# Patient Record
Sex: Male | Born: 2000 | Race: Black or African American | Hispanic: No | Marital: Single | Smoking: Never smoker
Health system: Southern US, Community
[De-identification: ages and names within clinical notes are randomized; demographics above are authoritative.]

## PROBLEM LIST (undated history)

## (undated) DIAGNOSIS — Z9229 Personal history of other drug therapy: Secondary | ICD-10-CM

## (undated) DIAGNOSIS — K2970 Gastritis, unspecified, without bleeding: Secondary | ICD-10-CM

## (undated) DIAGNOSIS — F40240 Claustrophobia: Secondary | ICD-10-CM

## (undated) DIAGNOSIS — F32A Depression, unspecified: Secondary | ICD-10-CM

## (undated) DIAGNOSIS — F411 Generalized anxiety disorder: Secondary | ICD-10-CM

## (undated) DIAGNOSIS — I10 Essential (primary) hypertension: Secondary | ICD-10-CM

## (undated) DIAGNOSIS — F909 Attention-deficit hyperactivity disorder, unspecified type: Secondary | ICD-10-CM

## (undated) DIAGNOSIS — K219 Gastro-esophageal reflux disease without esophagitis: Secondary | ICD-10-CM

## (undated) DIAGNOSIS — F419 Anxiety disorder, unspecified: Secondary | ICD-10-CM

## (undated) HISTORY — DX: Gastritis, unspecified, without bleeding: K29.70

## (undated) HISTORY — DX: Claustrophobia: F40.240

## (undated) HISTORY — PX: ABDOMINAL SURGERY: SHX537

---
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
## 2021-01-01 ENCOUNTER — Encounter (INDEPENDENT_AMBULATORY_CARE_PROVIDER_SITE_OTHER): Payer: Self-pay

## 2021-01-01 NOTE — Nursing Progress Note (Signed)
Day Before Surgery Confirmation Call    Spoke to:  Left Message: for patient    Confirmed surgery date, arrival time, and location.   Arrival: 0530  Surgery: 0730    NPO instructions reviewed: Clear liquids up to 2 hours prior to arrival time, then NPO. No solid food 8 hours prior to scheduled procedure time. Examples of clear liquids include water, apple juice, sports drinks such as Gatorade, coffee or tea without milk or cream. Sugar or sweetener may be added    Fasting Requirements per Preoperative Fasting Guidelines for Elective Surgeries and Procedures Requiring Anesthesia Policy Revised 01/2020  Ingested material Fasting requirement   Clear liquids/Ice Chips 2 hours prior to arrival time   Breast milk 4 hours prior to scheduled procedure time   Infant formula 6 hours prior to scheduled procedure time   Non-human milk 8 hours prior to scheduled procedure time   Solid food 8 hours prior to scheduled procedure time       Ambulatory Screening Tool:   Patient instructed that if you or your family does develop new symptoms of acute respiratory illness, to contact surgeons office immediately, prior to arrival at the hospital.     Patient denies recent visit to ED, hospitalization or PCP visit for acute illness since PSS RN interview.     Visitor Restriction Guidelines per [facility] Policy as of 12/04/20:  All visitors must adhere to the following:   . Exhibit no COVID-19 symptoms   . Age 16+   . In keeping with the CDC's current guidance, regardless of vaccination status, everyone in a healthcare facility must wear a mask covering their mouth and nose the entire time they are in the facility. Visitors who fail to wear a mask properly will be asked to leave. The following face coverings cannot be worn at any [place] location: gaiter style masks, bandanas or vented masks.   . No visitors are allowed for patients with suspected or confirmed COVID-19, except in end-of-life situations.  Hospital Inpatient:   Visitation  hours: 9:00 a.m. - 6:30 p.m. daily   . Adult patients may have two (age 16+) per day.  . Pediatric patients may have two parents/guardians at bedside 24/7.   Outpatient/Ambulatory Surgery:   . Adult patients may have only one designated visitor (age 16+) to accompany the patient  . For pediatric outpatients, only parents/guardians at bedside.   . No family members/visitors will be allowed into Phase 1 recovery areas. Physicians will call contact person to give report of procedure.   . Family / visitor will be called by PACU staff  to review discharge instructions via phone and answer any questions.     Advise to call surgeon if need to cancel surgery arises.     Patient verbalized understanding and acceptance of above information.  If a voicemail is left for the patient and any of the COVID19 ambulatory screening questions are positive, patient is asked to call PSS ASAP.

## 2021-01-03 NOTE — Anesthesia Preprocedure Evaluation (Addendum)
Anesthesia Evaluation    AIRWAY    Mallampati: II    TM distance: >3 FB  Neck ROM: full  Mouth Opening:full  Planned to use difficult airway equipment: Yes CARDIOVASCULAR    cardiovascular exam normal       DENTAL    no notable dental hx     PULMONARY    pulmonary exam normal     OTHER FINDINGS    BMI 52  GERD  HTN  Anxiety/depression  ADHD            Relevant Problems   NEURO/PSYCH   (+) Migraine      CARDIO   (+) Benign essential hypertension   (+) Migraine      GI   (+) Gastroesophageal reflux disease without esophagitis               Anesthesia Plan    ASA 3     general                     intravenous induction   Detailed anesthesia plan: general endotracheal        Post op pain management: per surgeon    informed consent obtained    Plan discussed with CRNA.    ECG reviewed  pertinent labs reviewed             Signed by: Lucile Shutters, MD 01/03/21 11:45 AM

## 2021-01-04 ENCOUNTER — Inpatient Hospital Stay: Payer: Commercial Managed Care - POS | Admitting: Anesthesiology

## 2021-01-04 ENCOUNTER — Encounter: Payer: Self-pay | Admitting: Surgery

## 2021-01-04 ENCOUNTER — Inpatient Hospital Stay
Admission: RE | Admit: 2021-01-04 | Discharge: 2021-01-05 | DRG: 621 | Disposition: A | Discharge status: Home / Self Care | Payer: Commercial Managed Care - POS | Source: Ambulatory Visit | Attending: Surgery | Admitting: Surgery

## 2021-01-04 ENCOUNTER — Encounter
Admission: RE | Disposition: A | Discharge status: Home / Self Care | Payer: Self-pay | Source: Ambulatory Visit | Attending: Surgery

## 2021-01-04 DIAGNOSIS — F411 Generalized anxiety disorder: Secondary | ICD-10-CM | POA: Diagnosis present

## 2021-01-04 DIAGNOSIS — K219 Gastro-esophageal reflux disease without esophagitis: Secondary | ICD-10-CM | POA: Diagnosis present

## 2021-01-04 DIAGNOSIS — I10 Essential (primary) hypertension: Secondary | ICD-10-CM | POA: Diagnosis present

## 2021-01-04 DIAGNOSIS — J029 Acute pharyngitis, unspecified: Secondary | ICD-10-CM | POA: Diagnosis not present

## 2021-01-04 DIAGNOSIS — Z79899 Other long term (current) drug therapy: Secondary | ICD-10-CM

## 2021-01-04 DIAGNOSIS — F902 Attention-deficit hyperactivity disorder, combined type: Secondary | ICD-10-CM | POA: Diagnosis present

## 2021-01-04 DIAGNOSIS — R141 Gas pain: Secondary | ICD-10-CM | POA: Diagnosis not present

## 2021-01-04 HISTORY — PX: LAPAROSCOPIC, GASTRECTOMY SLEEVE: SHX4495

## 2021-01-04 LAB — GLUCOSE WHOLE BLOOD - POCT: Whole Blood Glucose POCT: 91 mg/dL (ref 70–100)

## 2021-01-04 SURGERY — LAPAROSCOPIC, GASTRECTOMY SLEEVE
Anesthesia: Anesthesia General | Site: Abdomen | Wound class: Clean Contaminated

## 2021-01-04 MED ORDER — NEOSTIGMINE METHYLSULFATE 1 MG/ML IJ/IV SOLN (WRAP)
Status: AC
Start: 2021-01-04 — End: ?
  Filled 2021-01-04: qty 10

## 2021-01-04 MED ORDER — ONDANSETRON HCL 4 MG/2ML IJ SOLN
4.0000 mg | Freq: Once | INTRAMUSCULAR | Status: DC | PRN
Start: 2021-01-04 — End: 2021-01-04

## 2021-01-04 MED ORDER — DEXTROSE 50 % IV SOLN
25.0000 g | INTRAVENOUS | Status: DC | PRN
Start: 2021-01-04 — End: 2021-01-05

## 2021-01-04 MED ORDER — GABAPENTIN 300 MG PO CAPS
300.0000 mg | ORAL_CAPSULE | Freq: Three times a day (TID) | ORAL | Status: DC
Start: 2021-01-05 — End: 2021-01-05
  Administered 2021-01-05 (×2): 300 mg via ORAL
  Filled 2021-01-04 (×2): qty 1

## 2021-01-04 MED ORDER — PROPOFOL 10 MG/ML IV EMUL (WRAP)
INTRAVENOUS | Status: DC | PRN
Start: 2021-01-04 — End: 2021-01-04
  Administered 2021-01-04: 370 mg via INTRAVENOUS

## 2021-01-04 MED ORDER — ACETAMINOPHEN 10 MG/ML IV SOLN
1000.0000 mg | Freq: Once | INTRAVENOUS | Status: DC
Start: 2021-01-04 — End: 2021-01-04

## 2021-01-04 MED ORDER — ACETAMINOPHEN 10 MG/ML IV SOLN
INTRAVENOUS | Status: AC
Start: 2021-01-04 — End: ?
  Filled 2021-01-04: qty 100

## 2021-01-04 MED ORDER — MAGNESIUM SULFATE IN D5W 1-5 GM/100ML-% IV SOLN
INTRAVENOUS | Status: AC
Start: 2021-01-04 — End: ?
  Filled 2021-01-04: qty 200

## 2021-01-04 MED ORDER — FAMOTIDINE 10 MG/ML IV SOLN (WRAP)
20.0000 mg | INTRAVENOUS | Status: AC
Start: 2021-01-04 — End: 2021-01-04
  Administered 2021-01-04: 07:00:00 20 mg via INTRAVENOUS

## 2021-01-04 MED ORDER — ROCURONIUM BROMIDE 50 MG/5ML IV SOLN
INTRAVENOUS | Status: AC
Start: 2021-01-04 — End: ?
  Filled 2021-01-04: qty 5

## 2021-01-04 MED ORDER — GABAPENTIN 300 MG PO CAPS
ORAL_CAPSULE | ORAL | Status: AC
Start: 2021-01-04 — End: ?
  Filled 2021-01-04: qty 1

## 2021-01-04 MED ORDER — BUPIVACAINE LIPOSOME 1.3 % IJ SUSP
INTRAMUSCULAR | Status: DC | PRN
Start: 2021-01-04 — End: 2021-01-04
  Administered 2021-01-04: 20 mL

## 2021-01-04 MED ORDER — HYDROMORPHONE HCL 0.5 MG/0.5 ML IJ SOLN
0.5000 mg | INTRAMUSCULAR | Status: DC | PRN
Start: 2021-01-04 — End: 2021-01-04

## 2021-01-04 MED ORDER — SODIUM CHLORIDE 0.9 % IV BOLUS
500.0000 mL | INTRAVENOUS | Status: DC
Start: 2021-01-04 — End: 2021-01-04
  Administered 2021-01-04: 07:00:00 500 mL via INTRAVENOUS

## 2021-01-04 MED ORDER — GABAPENTIN 300 MG PO CAPS
300.0000 mg | ORAL_CAPSULE | Freq: Once | ORAL | Status: AC
Start: 2021-01-04 — End: 2021-01-04
  Administered 2021-01-04: 07:00:00 300 mg via ORAL

## 2021-01-04 MED ORDER — OXYCODONE HCL 5 MG/5ML PO SOLN
5.0000 mg | ORAL | Status: DC | PRN
Start: 2021-01-05 — End: 2021-01-05
  Administered 2021-01-05: 5 mg via ORAL
  Filled 2021-01-04: qty 5

## 2021-01-04 MED ORDER — SUCCINYLCHOLINE CHLORIDE 20 MG/ML IJ SOLN
INTRAMUSCULAR | Status: AC
Start: 2021-01-04 — End: ?
  Filled 2021-01-04: qty 10

## 2021-01-04 MED ORDER — DEXTROSE 5% IV BOLUS
250.0000 mL | INTRAVENOUS | Status: DC | PRN
Start: 2021-01-04 — End: 2021-01-05

## 2021-01-04 MED ORDER — LACTATED RINGERS IR SOLN
Status: AC | PRN
Start: 2021-01-04 — End: 2021-01-04
  Administered 2021-01-04: 1000 mL

## 2021-01-04 MED ORDER — CEFTRIAXONE SODIUM 2 G IJ SOLR
INTRAMUSCULAR | Status: AC
Start: 2021-01-04 — End: ?
  Filled 2021-01-04: qty 2000

## 2021-01-04 MED ORDER — DIPHENHYDRAMINE HCL 50 MG/ML IJ SOLN
12.5000 mg | INTRAMUSCULAR | Status: DC | PRN
Start: 2021-01-04 — End: 2021-01-04

## 2021-01-04 MED ORDER — EPHEDRINE SULFATE 50 MG/ML IJ/IV SOLN (WRAP)
Status: DC | PRN
Start: 2021-01-04 — End: 2021-01-04
  Administered 2021-01-04: 5 mg via INTRAVENOUS
  Administered 2021-01-04 (×2): 10 mg via INTRAVENOUS

## 2021-01-04 MED ORDER — PROPOFOL 10 MG/ML IV EMUL (WRAP)
INTRAVENOUS | Status: AC
Start: 2021-01-04 — End: ?
  Filled 2021-01-04: qty 20

## 2021-01-04 MED ORDER — LIDOCAINE HCL 2 % IJ SOLN
INTRAMUSCULAR | Status: DC | PRN
Start: 2021-01-04 — End: 2021-01-04
  Administered 2021-01-04: 40 mg
  Administered 2021-01-04: 60 mg

## 2021-01-04 MED ORDER — GLYCOPYRROLATE 0.2 MG/ML IJ SOLN (WRAP)
INTRAMUSCULAR | Status: DC | PRN
Start: 2021-01-04 — End: 2021-01-04
  Administered 2021-01-04: .8 mg via INTRAVENOUS

## 2021-01-04 MED ORDER — ENOXAPARIN SODIUM 60 MG/0.6ML SC SOLN
60.0000 mg | Freq: Two times a day (BID) | SUBCUTANEOUS | Status: DC
Start: 2021-01-05 — End: 2021-01-05
  Administered 2021-01-05: 10:00:00 60 mg via SUBCUTANEOUS
  Filled 2021-01-04: qty 0.6

## 2021-01-04 MED ORDER — BUPIVACAINE LIPOSOME 1.3 % IJ SUSP
20.0000 mL | Freq: Once | INTRAMUSCULAR | Status: AC
Start: 2021-01-04 — End: 2021-01-04
  Administered 2021-01-04: 10:00:00 20 mL

## 2021-01-04 MED ORDER — HYOSCYAMINE SULFATE 0.125 MG SL SUBL
0.1250 mg | SUBLINGUAL_TABLET | Freq: Four times a day (QID) | SUBLINGUAL | Status: DC | PRN
Start: 2021-01-04 — End: 2021-01-05
  Administered 2021-01-04 – 2021-01-05 (×2): 0.125 mg via SUBLINGUAL
  Filled 2021-01-04 (×2): qty 1

## 2021-01-04 MED ORDER — ACETAMINOPHEN 160 MG/5ML PO SOLN
650.0000 mg | ORAL | Status: DC
Start: 2021-01-05 — End: 2021-01-05
  Administered 2021-01-05: 08:00:00 650 mg via ORAL
  Filled 2021-01-04 (×2): qty 20.3

## 2021-01-04 MED ORDER — ENOXAPARIN SODIUM 30 MG/0.3ML SC SOLN
30.0000 mg | Freq: Once | SUBCUTANEOUS | Status: AC
Start: 2021-01-04 — End: 2021-01-04
  Administered 2021-01-04: 07:00:00 30 mg via SUBCUTANEOUS

## 2021-01-04 MED ORDER — MIDAZOLAM HCL 1 MG/ML IJ SOLN (WRAP)
INTRAMUSCULAR | Status: DC | PRN
Start: 2021-01-04 — End: 2021-01-04
  Administered 2021-01-04: 2 mg via INTRAVENOUS

## 2021-01-04 MED ORDER — PROMETHAZINE HCL 25 MG/ML IJ SOLN
6.2500 mg | Freq: Once | INTRAMUSCULAR | Status: DC | PRN
Start: 2021-01-04 — End: 2021-01-04

## 2021-01-04 MED ORDER — DEXTROSE 10 % IV BOLUS
25.0000 g | INTRAVENOUS | Status: DC | PRN
Start: 2021-01-04 — End: 2021-01-05

## 2021-01-04 MED ORDER — FENTANYL CITRATE (PF) 50 MCG/ML IJ SOLN (WRAP)
25.0000 ug | INTRAMUSCULAR | Status: DC | PRN
Start: 2021-01-04 — End: 2021-01-04
  Administered 2021-01-04 (×2): 25 ug via INTRAVENOUS

## 2021-01-04 MED ORDER — PROCHLORPERAZINE EDISYLATE 10 MG/2ML IJ SOLN
5.0000 mg | Freq: Four times a day (QID) | INTRAMUSCULAR | Status: DC | PRN
Start: 2021-01-04 — End: 2021-01-05

## 2021-01-04 MED ORDER — SODIUM CHLORIDE 0.9 % IR SOLN
Status: DC | PRN
Start: 2021-01-04 — End: 2021-01-04
  Administered 2021-01-04: 1000 mL

## 2021-01-04 MED ORDER — POTASSIUM CHLORIDE IN NACL 20-0.9 MEQ/L-% IV SOLN
INTRAVENOUS | Status: DC
Start: 2021-01-04 — End: 2021-01-05

## 2021-01-04 MED ORDER — KETAMINE HCL 50 MG/ML IJ SOLN
INTRAMUSCULAR | Status: DC | PRN
Start: 2021-01-04 — End: 2021-01-04
  Administered 2021-01-04 (×5): 10 mg via INTRAVENOUS

## 2021-01-04 MED ORDER — MAGNESIUM SULFATE IN D5W 1-5 GM/100ML-% IV SOLN
1.0000 g | Freq: Once | INTRAVENOUS | Status: AC
Start: 2021-01-04 — End: 2021-01-04
  Administered 2021-01-04: 08:00:00 1 g via INTRAVENOUS

## 2021-01-04 MED ORDER — SIMETHICONE 80 MG PO CHEW
80.0000 mg | CHEWABLE_TABLET | Freq: Four times a day (QID) | ORAL | Status: DC | PRN
Start: 2021-01-05 — End: 2021-01-05

## 2021-01-04 MED ORDER — SODIUM CHLORIDE 0.9 % IV MBP
2.0000 g | Freq: Once | INTRAVENOUS | Status: AC
Start: 2021-01-04 — End: 2021-01-04
  Administered 2021-01-04: 08:00:00 2 g via INTRAVENOUS

## 2021-01-04 MED ORDER — FENTANYL CITRATE (PF) 50 MCG/ML IJ SOLN (WRAP)
INTRAMUSCULAR | Status: DC | PRN
Start: 2021-01-04 — End: 2021-01-04
  Administered 2021-01-04: 100 ug via INTRAVENOUS

## 2021-01-04 MED ORDER — SCOPOLAMINE 1 MG/3DAYS TD PT72
1.0000 | MEDICATED_PATCH | TRANSDERMAL | Status: DC | PRN
Start: 2021-01-04 — End: 2021-01-05

## 2021-01-04 MED ORDER — ROCURONIUM BROMIDE 10 MG/ML IV SOLN (WRAP)
INTRAVENOUS | Status: DC | PRN
Start: 2021-01-04 — End: 2021-01-04
  Administered 2021-01-04: 40 mg via INTRAVENOUS
  Administered 2021-01-04: 10 mg via INTRAVENOUS

## 2021-01-04 MED ORDER — HYDRALAZINE HCL 20 MG/ML IJ SOLN
10.0000 mg | Freq: Four times a day (QID) | INTRAMUSCULAR | Status: DC | PRN
Start: 2021-01-04 — End: 2021-01-05

## 2021-01-04 MED ORDER — FENTANYL CITRATE (PF) 50 MCG/ML IJ SOLN (WRAP)
INTRAMUSCULAR | Status: AC
Start: 2021-01-04 — End: ?
  Filled 2021-01-04: qty 2

## 2021-01-04 MED ORDER — PHENYLEPHRINE 100 MCG/ML IV BOLUS (ANESTHESIA)
PREFILLED_SYRINGE | INTRAVENOUS | Status: DC | PRN
Start: 2021-01-04 — End: 2021-01-04
  Administered 2021-01-04 (×3): 100 ug via INTRAVENOUS
  Administered 2021-01-04 (×2): 200 ug via INTRAVENOUS
  Administered 2021-01-04: 100 ug via INTRAVENOUS

## 2021-01-04 MED ORDER — MEPERIDINE HCL 25 MG/ML IJ SOLN
12.5000 mg | INTRAMUSCULAR | Status: DC | PRN
Start: 2021-01-04 — End: 2021-01-04

## 2021-01-04 MED ORDER — PROMETHAZINE HCL 25 MG/ML IJ SOLN
INTRAMUSCULAR | Status: AC
Start: 2021-01-04 — End: ?
  Filled 2021-01-04: qty 1

## 2021-01-04 MED ORDER — LACTATED RINGERS IV SOLN
75.0000 mL/h | INTRAVENOUS | Status: DC
Start: 2021-01-04 — End: 2021-01-05

## 2021-01-04 MED ORDER — SODIUM CHLORIDE (PF) 0.9 % IJ SOLN
INTRAMUSCULAR | Status: AC
Start: 2021-01-04 — End: ?
  Filled 2021-01-04: qty 40

## 2021-01-04 MED ORDER — LIDOCAINE HCL 2 % IJ SOLN
INTRAMUSCULAR | Status: AC
Start: 2021-01-04 — End: ?
  Filled 2021-01-04: qty 20

## 2021-01-04 MED ORDER — FAMOTIDINE 10 MG/ML IV SOLN (WRAP)
20.0000 mg | Freq: Two times a day (BID) | INTRAVENOUS | Status: DC
Start: 2021-01-04 — End: 2021-01-05
  Administered 2021-01-04 – 2021-01-05 (×2): 20 mg via INTRAVENOUS
  Filled 2021-01-04 (×2): qty 2

## 2021-01-04 MED ORDER — DIPHENHYDRAMINE HCL 50 MG/ML IJ SOLN
12.5000 mg | Freq: Four times a day (QID) | INTRAMUSCULAR | Status: DC | PRN
Start: 2021-01-04 — End: 2021-01-05

## 2021-01-04 MED ORDER — ONDANSETRON HCL 4 MG/2ML IJ SOLN
4.0000 mg | Freq: Three times a day (TID) | INTRAMUSCULAR | Status: DC | PRN
Start: 2021-01-04 — End: 2021-01-05
  Administered 2021-01-04: 21:00:00 4 mg via INTRAVENOUS
  Filled 2021-01-04: qty 2

## 2021-01-04 MED ORDER — SODIUM CHLORIDE 0.9 % IV SOLN
INTRAVENOUS | Status: DC | PRN
Start: 2021-01-04 — End: 2021-01-04
  Administered 2021-01-04: 08:00:00 .5 ug/kg/h via INTRAVENOUS

## 2021-01-04 MED ORDER — MIDAZOLAM HCL 1 MG/ML IJ SOLN (WRAP)
INTRAMUSCULAR | Status: AC
Start: 2021-01-04 — End: ?
  Filled 2021-01-04: qty 2

## 2021-01-04 MED ORDER — GLYCOPYRROLATE 1 MG/5ML IJ SOLN
INTRAMUSCULAR | Status: AC
Start: 2021-01-04 — End: ?
  Filled 2021-01-04: qty 5

## 2021-01-04 MED ORDER — FAMOTIDINE 20 MG/2ML IV SOLN
INTRAVENOUS | Status: AC
Start: 2021-01-04 — End: ?
  Filled 2021-01-04: qty 2

## 2021-01-04 MED ORDER — LACTATED RINGERS IV SOLN
INTRAVENOUS | Status: DC
Start: 2021-01-04 — End: 2021-01-04

## 2021-01-04 MED ORDER — DEXMEDETOMIDINE HCL IN NACL 200 MCG/50ML IV SOLN
INTRAVENOUS | Status: AC
Start: 2021-01-04 — End: ?
  Filled 2021-01-04: qty 50

## 2021-01-04 MED ORDER — SODIUM CHLORIDE BACTERIOSTATIC 0.9 % IJ SOLN
INTRAMUSCULAR | Status: DC | PRN
Start: 2021-01-04 — End: 2021-01-04
  Administered 2021-01-04: 10 mL via INTRAMUSCULAR

## 2021-01-04 MED ORDER — HYDROMORPHONE HCL 0.5 MG/0.5 ML IJ SOLN
0.5000 mg | INTRAMUSCULAR | Status: DC | PRN
Start: 2021-01-04 — End: 2021-01-05
  Administered 2021-01-04 – 2021-01-05 (×5): 0.5 mg via INTRAVENOUS
  Filled 2021-01-04 (×5): qty 0.5

## 2021-01-04 MED ORDER — ONDANSETRON HCL 4 MG/2ML IJ SOLN
INTRAMUSCULAR | Status: AC
Start: 2021-01-04 — End: ?
  Filled 2021-01-04: qty 2

## 2021-01-04 MED ORDER — PROMETHAZINE HCL 25 MG/ML IJ SOLN
INTRAMUSCULAR | Status: DC | PRN
Start: 2021-01-04 — End: 2021-01-04
  Administered 2021-01-04: 12.5 mg via INTRAVENOUS

## 2021-01-04 MED ORDER — BUPIVACAINE HCL (PF) 0.25 % IJ SOLN
INTRAMUSCULAR | Status: DC | PRN
Start: 2021-01-04 — End: 2021-01-04
  Administered 2021-01-04: 30 mL

## 2021-01-04 MED ORDER — ACETAMINOPHEN 10 MG/ML IV SOLN
1000.0000 mg | Freq: Four times a day (QID) | INTRAVENOUS | Status: AC
Start: 2021-01-04 — End: 2021-01-05
  Administered 2021-01-04 – 2021-01-05 (×3): 1000 mg via INTRAVENOUS
  Filled 2021-01-04 (×3): qty 100

## 2021-01-04 MED ORDER — KETAMINE HCL 50 MG/ML IJ SOLN
INTRAMUSCULAR | Status: AC
Start: 2021-01-04 — End: ?
  Filled 2021-01-04: qty 1

## 2021-01-04 MED ORDER — GLUCAGON 1 MG IJ SOLR (WRAP)
1.0000 mg | INTRAMUSCULAR | Status: DC | PRN
Start: 2021-01-04 — End: 2021-01-05

## 2021-01-04 MED ORDER — BUPIVACAINE HCL (PF) 0.25 % IJ SOLN
INTRAMUSCULAR | Status: AC
Start: 2021-01-04 — End: ?
  Filled 2021-01-04: qty 120

## 2021-01-04 MED ORDER — SUCCINYLCHOLINE CHLORIDE 20 MG/ML IJ SOLN
INTRAMUSCULAR | Status: DC | PRN
Start: 2021-01-04 — End: 2021-01-04
  Administered 2021-01-04: 200 mg via INTRAVENOUS

## 2021-01-04 MED ORDER — ALBUTEROL SULFATE (2.5 MG/3ML) 0.083% IN NEBU
2.5000 mg | INHALATION_SOLUTION | Freq: Four times a day (QID) | RESPIRATORY_TRACT | Status: DC | PRN
Start: 2021-01-04 — End: 2021-01-05

## 2021-01-04 MED ORDER — LISINOPRIL 20 MG PO TABS
20.0000 mg | ORAL_TABLET | Freq: Every day | ORAL | Status: DC
Start: 2021-01-05 — End: 2021-01-05
  Administered 2021-01-05: 10:00:00 20 mg via ORAL
  Filled 2021-01-04: qty 1

## 2021-01-04 MED ORDER — SODIUM CHLORIDE 0.9 % IV SOLN
2.0000 ug/kg/min | INTRAVENOUS | Status: DC
Start: 2021-01-04 — End: 2021-01-04
  Filled 2021-01-04: qty 20

## 2021-01-04 MED ORDER — ENOXAPARIN SODIUM 30 MG/0.3ML SC SOLN
SUBCUTANEOUS | Status: AC
Start: 2021-01-04 — End: ?
  Filled 2021-01-04: qty 0.3

## 2021-01-04 MED ORDER — CEFAZOLIN SODIUM 1 G IJ SOLR
INTRAMUSCULAR | Status: AC
Start: 2021-01-04 — End: ?
  Filled 2021-01-04: qty 4000

## 2021-01-04 MED ORDER — ONDANSETRON HCL 4 MG/2ML IJ SOLN
INTRAMUSCULAR | Status: DC | PRN
Start: 2021-01-04 — End: 2021-01-04
  Administered 2021-01-04: 4 mg via INTRAVENOUS

## 2021-01-04 MED ORDER — NEOSTIGMINE METHYLSULFATE 1 MG/ML IJ/IV SOLN (WRAP)
Status: DC | PRN
Start: 2021-01-04 — End: 2021-01-04
  Administered 2021-01-04: 5 mg via INTRAVENOUS

## 2021-01-04 MED ORDER — BUPIVACAINE LIPOSOME 1.3 % IJ SUSP
INTRAMUSCULAR | Status: AC
Start: 2021-01-04 — End: ?
  Filled 2021-01-04: qty 20

## 2021-01-04 MED ORDER — SUGAMMADEX SODIUM 200 MG/2ML IV SOLN
INTRAVENOUS | Status: DC | PRN
Start: 2021-01-04 — End: 2021-01-04
  Administered 2021-01-04: 200 mg via INTRAVENOUS

## 2021-01-04 MED ORDER — FENTANYL CITRATE (PF) 50 MCG/ML IJ SOLN (WRAP)
INTRAMUSCULAR | Status: AC
Start: 2021-01-04 — End: 2021-01-04
  Administered 2021-01-04: 10:00:00 25 ug via INTRAVENOUS
  Filled 2021-01-04: qty 2

## 2021-01-04 SURGICAL SUPPLY — 142 items
ADHESIVE SKIN CLOSURE DERMABOND ADVANCED (Skin Closure) ×1 IMPLANT
ADHESIVE SKIN CLOSURE DERMABOND ADVANCED .7 ML LIQUID APPLICATOR (Skin Closure) ×1 IMPLANT
ADHESIVE SKIN DERMABOND ADV (Skin Closure) ×1
ADHESIVE SKNCLS 2 OCTYL CYNCRLT .7ML (Skin Closure) ×1
APPLICATOR CHLORAPREP 26 ML 70% ISOPROPYL ALCOHOL 2% CHLORHEXIDINE (Applicator) ×2 IMPLANT
APPLICATOR PRP 70% ISPRP 2% CHG 26ML (Applicator) ×2 IMPLANT
BLADE SRGCLPR LF STRL PVT ADJ HD DISP (Blade)
BLADE SURGICAL CLIPPER 9660 (Blade)
BLADE SURGICAL CLIPPER PIVOT ADJUSTABLE (Blade) IMPLANT
BLADE SURGICAL CLIPPER PIVOT ADJUSTABLE HEAD 9661 PURPLE (Blade) IMPLANT
CANULA STABILITY 5MM (Procedure Accessories) ×2
CATH JELCO IV RADIOPQ 14GX2IN (IV Supply)
CATHETER IV JELCO 14GA 2IN STRL RADOPQ (IV Supply)
CATHETER IV OD14 GA L2 IN RADIOPAQUE (IV Supply) IMPLANT
CATHETER IV OD14 GA L2 IN RADIOPAQUE JELCO (IV Supply) IMPLANT
CHLORAPREP APLCTR ORANGE 26ML (Applicator) ×2
CLIP SUT PDS LPR TY VCL LF STRL ABS (Suture) ×1
CLIP SUTURE ABSORBABLE LAPRA-TY VICRYL (Suture) ×1 IMPLANT
CLIP SUTURE ABSORBABLE LAPRA-TY VICRYL POLYDIOXANONE (Suture) ×1 IMPLANT
DEVICE LIGASURE MD JAW 44CM (Laparoscopy Supplies) ×1
DEVICE SUT E STCH 10MM 15IN LF STRL (Laparoscopy Supplies)
DEVICE SUTUREURING ENDO STITCH (Laparoscopy Supplies)
DEVICE SUTURING L15 IN PUSH BUTTON GRIP (Laparoscopy Supplies) IMPLANT
DEVICE SUTURING L15 IN PUSH BUTTON GRP HNDL 10MM ENDO STTCH ENDOSCOPIC (Laparoscopy Supplies) IMPLANT
DRAPE SRG SMS .75 PRXM 77X53IN LF STRL (Drape) ×1
DRAPE SURG 3/4 53X77IN (Drape) ×1
DRAPE SURGICAL SHEET L77 IN X W53 IN (Drape) ×1 IMPLANT
DRAPE SURGICAL SHEET L77 IN X W53 IN MEDLINE SMS 3/4 BLUE (Drape) ×1 IMPLANT
GEN LAP SHARED KIT (Tray) ×1
GLOVE SRG 7 BGL SRG LTX STRL PF BEAD CUF (Glove) ×2
GLOVE SRG PLISPRN 7 BGL PI INDCTR (Glove) ×1
GLOVE SRG PLISPRN 7.5 BGL PI INDCTR (Glove) ×1
GLOVE SURG BIOGEL PF LTX SZ7.0 (Glove) ×2
GLOVE SURGICAL 7 1/2 BIOGEL PI INDICATOR (Glove) ×1 IMPLANT
GLOVE SURGICAL 7 1/2 BIOGEL PI INDICATOR UNDERGLOVE POWDER FREE SMOOTH (Glove) ×1 IMPLANT
GLOVE SURGICAL 7 BIOGEL PI INDICATOR (Glove) ×1 IMPLANT
GLOVE SURGICAL 7 BIOGEL PI INDICATOR UNDERGLOVE POWDER FREE SMOOTH (Glove) ×1 IMPLANT
GLOVE SURGICAL 7 BIOGEL SURGEONS POWDER (Glove) ×2 IMPLANT
GLOVE SURGICAL 7 BIOGEL SURGEONS POWDER FREE BEAD CUFF TEXTURE SURFACE (Glove) ×2 IMPLANT
HANDLE LGHT ASPN LF STRL SNPON (Procedure Accessories) ×1
HANDLE LIGHT ALC PLUS DISPOSABLE STERILE (Procedure Accessories) ×1
HANDLE LIGHT SNAP ON ASPEN (Procedure Accessories) ×1 IMPLANT
HOLDER ENDOSCOPIC INSTRUMENT TUBE SET (Endoscopic Supplies) ×1 IMPLANT
HOLDER ENDOSCOPIC INSTRUMENT TUBE SET ACTIVATION BUTTON WINGMAN (Endoscopic Supplies) ×1 IMPLANT
IRRIGATOR SUCTION ERGONOMIC HAND PIECE STRYKEFLOW II (Suction) ×1 IMPLANT
IRRIGATOR SUCTION STRYKEFLOW 2 (Suction) ×1 IMPLANT
IRRIGATOR SUCTN PUMP/HANDPIECE (Suction) ×1
KIT ADV LAPSCPC CARE (Kits) ×1
KIT INFECTION CONTROL CUSTOM (Kits) ×2 IMPLANT
KIT INFECTION CONTROL CUSTOM IFOH03 (Kits) ×1 IMPLANT
KIT SRGBSN LF STRL 2 FOAK DISP (Procedure Accessories) ×1
KIT SURGICAL BASIN 2 FOAK (Procedure Accessories) ×1 IMPLANT
KIT SURGICAL BASIN 2 FOAK MEDLINE INDUSTRIES, INC. (Procedure Accessories) ×1 IMPLANT
LABEL MED LF STRL PK CSTM DISP (Other) ×1
LABEL MEDICAL PACK CUSTOM (Other) ×1 IMPLANT
LABEL MEDICAL PACK CUSTOM CL22094 CUSTOM MED LABEL PACK (Other) ×1 IMPLANT
LABEL SHEET CUSTOM GENERAL (Other) ×1
LACTATED RINGERS 1000ML (IV Solutions) ×1
MAT PATIENT L81 IN X W39 IN M2 (Positioning Supplies) ×1 IMPLANT
MAT PATIENT L81 IN X W39 IN M2 MICROCLIMATE BODY PAD PREVALON MOBILE (Positioning Supplies) ×1 IMPLANT
MAT PT PREVALON 81X39IN M2 MICROCLIMATE (Positioning Supplies) ×1
MATRIX SEAMGRD BIOABSRB TRI60B (Sealant) ×2 IMPLANT
MATRIX SEAMGRD BIOABSRB TRI60P (Sealant) ×3 IMPLANT
NEEDLE SPINAL 20G X 3.5IN (Needles) ×1
NEEDLE SPINAL L3 1/2 IN REGULAR WALL QUINCKE TIP OD20 GA BD (Needles) ×1 IMPLANT
NEEDLE SPNL PP RW BD QNCK 20GA 3.5IN LF (Needles) ×1 IMPLANT
PACK SRG LF STRL GN LAP SHR DISP (Tray) ×1
PACK SURGICAL GENERAL LAP SHARE (Tray) ×1 IMPLANT
PACK SURGICAL GENERAL LAP SHARE MEDLINE INDUSTRIES, INC (Tray) ×1 IMPLANT
PREVALON MAT SYSTEM QC BLEND (Positioning Supplies) ×1
REINFORCEMENT STAPLE LINE BIOABSORBABLE (Sealant) ×5 IMPLANT
REINFORCEMENT STAPLE LINE BIOABSORBABLE GORE SEAMGUARD PURPLE ENDO GIA (Sealant) ×3 IMPLANT
REINFORCEMENT STAPLE LN BBSRBBL SMGRD BLCK ENDO GIA 60 ULTRA UNIVERSAL (Sealant) ×2 IMPLANT
REINFORCEMENT STPL LN SMGRD LF STRL (Sealant) ×5 IMPLANT
RELOAD ARTICU EX THICK 60MM (Staplers) ×2
RELOAD MEDIUM THICK 60MM (Staplers) ×2
RELOAD STAPLER 3 MM 3.5 MM 4 MM L60 MM (Staplers) ×2 IMPLANT
RELOAD STAPLER 3 MM 3.5 MM 4 MM L60 MM ENDO GIA TITANIUM MEDIUM THICK (Staplers) ×2 IMPLANT
RELOAD STAPLER L60 MM EXTRA THICK TISSUE (Staplers) ×2 IMPLANT
RELOAD STAPLER L60 MM XTR THCK TISSUE ARTICULATE BLCK SIGNIA STPL SSTM (Staplers) ×2 IMPLANT
RELOAD STAPLER MEDIUM THICK CURVE L60 MM (Staplers) ×1 IMPLANT
RELOAD STAPLER MEDIUM THICK CURVE L60 MM TRI-STAPLE 2.0 PURPLE (Staplers) ×1 IMPLANT
RELOAD STPLR 60MM LF STRL ARTC X THKTIS (Staplers) ×2
RELOAD STPLR TI 3MM 3.5MM 4MM EGIA 60MM (Staplers) ×2
SCISSOR ENDOCUT DISP LAPO (Instrument) ×2
SEALER/DIVIDER LAPAROSCOPIC L44 CM 350 D (Laparoscopy Supplies) ×1 IMPLANT
SEALER/DIVIDER LAPAROSCOPIC L44 CM 350 D L18.5 MM MARYLAND CURVE JAW (Laparoscopy Supplies) ×1 IMPLANT
SEALER/DIVIDER LAPSCP 350D 18.5MM LGSR (Laparoscopy Supplies) ×1
SET DOUBLE BASIN (Procedure Accessories) ×1
SET HIGH FLOW SMOKE EVACUATION (Tubing) ×1 IMPLANT
SET HIGH FLOW SMOKE EVACUATION PNEUMOCLEAR TUBING (Tubing) ×1 IMPLANT
SET TUBE W/BTN F/SCOPE HLDR (Endoscopic Supplies) ×2
SET TUBING PNEUMOCLEAR HFLO SMK EVAC (Tubing) ×1
SHELL SIGNIA PWR CONTRL STAPLR (Staplers) ×1
SLEEVE LAPAROSCOPIC L100 MM UNIVERSAL (Procedure Accessories) ×2
SLEEVE LAPSCP UNV EPTH XCL 5MM 100MM (Procedure Accessories) ×2 IMPLANT
SLEEVE LAPSCP UNV EPTH XCL 5MM 100MM LF (Procedure Accessories) ×2
SLEEVE TROCAR L100 MM UNIVERSAL STABILITY OD5 MM ENDOPATH XCEL (Procedure Accessories) ×2 IMPLANT
SOLUTION EXIDINE CHG 4% 4OZ (Scrub Supplies) ×1
SOLUTION IV LACTATED RINGERS 1000 ML (IV Solutions) ×1 IMPLANT
SOLUTION IV LACTATED RINGERS 1000 ML PLASTIC CONTAINER (IV Solutions) ×1 IMPLANT
SOLUTION IV LR 1000ML VFLX LF PLS CNTNR (IV Solutions) ×1
SOLUTION PRP 4% CHG 4OZ SCR CR EXDN ANMC (Scrub Supplies) ×1 IMPLANT
SPONGE LAP CTTN 18X18IN LF STRL 4 PLY (Sponge) ×1
SPONGE LAPAROTOMY 18X18IN (Sponge) ×1
SPONGE LAPAROTOMY L18 IN X W18 IN 4 PLY (Sponge) ×1 IMPLANT
SPONGE LAPAROTOMY L18 IN X W18 IN 4 PLY RADIOPAQUE PYRONEMA FREE HIGH (Sponge) ×1 IMPLANT
STAPLER ENDOGIA ART MED 60MM (Staplers) ×2
STAPLER POWER POWER CONTROL SHELL SIGNIA (Staplers) ×1 IMPLANT
STAPLER PWR SIGNIA PWR CNTRL SHL (Staplers) ×1
SUT VICRYL 0 54IN (Suture) ×1
SUTURE ABS 0 VCL 54IN BRD TIE COAT VIOL (Suture) ×1
SUTURE ABS 2-0 SH VCL 27IN BRD COAT VIOL (Suture) ×4
SUTURE ABS 4-0 PS2 MNCRL MTPS 18IN MFL (Suture) ×2
SUTURE COATED VICRYL 0 L54 IN BRAID TIES (Suture) ×1 IMPLANT
SUTURE COATED VICRYL 2-0 SH L27 IN BRAID (Suture) ×4 IMPLANT
SUTURE COATED VICRYL 2-0 SH L27 IN BRAID COATED VIOLET ABSORBABLE (Suture) ×4 IMPLANT
SUTURE LAPRA TY ABSORB CLIP (Suture) ×1
SUTURE MONOCRYL 4-0 PS-2 L18 IN (Suture) ×2 IMPLANT
SUTURE MONOCRYL 4-0 PS-2 L18 IN MONOFILAMENT UNDYED ABSORBABLE (Suture) ×2 IMPLANT
SUTURE MONOCRYL 4-0 PS2 18 IN (Suture) ×2
SUTURE VICRYL 2-0 SH 27IN (Suture) ×4
SYRINGE 30 ML CONCENTRIC TIP GRADUATE (Syringes, Needles) ×2 IMPLANT
SYRINGE 30 ML CONCENTRIC TIP GRADUATE NONPYROGENIC DEHP FREE LOK (Syringes, Needles) ×2 IMPLANT
SYRINGE LUER LOCK WO SFTY 30ML (Syringes, Needles) ×2
SYRINGE MED 30ML LL LF STRL CONC TIP (Syringes, Needles) ×2
SYSTEM IMAGING 8X6IN CLEARIFY MICROFIBER WARM HUB TRCR WIPE DSPSBL (Kits) ×1 IMPLANT
SYSTEM IMG MRFBR CLEARIFY 8X6IN WRM HUB (Kits) ×1 IMPLANT
TIP SCISSORS ENDOCUT L19.3 MM (Instrument) ×2 IMPLANT
TIP SCSR ENDCT 19.3MM DISP (Instrument) ×2
TROCAR BLADELESS ENDO 5X100MM (Laparoscopy Supplies) ×1
TROCAR ENDOPATH XCEL 15X100MM (Laparoscopy Supplies) ×1
TROCAR LAPAROSCOPIC L100 MM OD15 MM STABILITY SLEEVE BLADELESS (Laparoscopy Supplies) ×1 IMPLANT
TROCAR LAPAROSCOPIC STABILITY SLEEVE (Laparoscopy Supplies) ×1 IMPLANT
TROCAR LAPAROSCOPIC STABILITY SLVE (Laparoscopy Supplies) ×1
TROCAR LAPSCP EPTH XCL 15MM 100MM LF (Laparoscopy Supplies) ×1
TROCAR LAPSCP EPTH XCL 5MM 100MM LF STRL (Laparoscopy Supplies) ×1 IMPLANT
TROCAR OD5 MM L100 MM BLADELESS STABLE SLEEVE ENDOPATH XCEL ENDOSCOPIC (Laparoscopy Supplies) ×1 IMPLANT
TUBING PNEUMOCLR SMOKE EVAC HF (Tubing) ×1
TUBING SCT IRR (Suction) ×1
UNDRGLOV SZ 7 PI INDICATR BLUE (Glove) ×1
UNDRGLOV SZ7.5 PI INDICATR BLU (Glove) ×1

## 2021-01-04 NOTE — Respiratory Progress Note (Signed)
According to patient, he does not use a home cpap. D/c order

## 2021-01-04 NOTE — Discharge Instr - AVS First Page (Addendum)
Drs. Moazzez, Nain, and Pourshojae   [address], [address]   [place], [place] [address]   (O) [phone]   (F) [phone]    [address], [address]   [place], [place] [address]   (O) [phone]   (F) [phone]       PATIENT DISCHARGE INFORMATION: BARIATRIC SURGERY    We are concerned about your health and have developed a few guidelines to help you during your hospitalization and at home. Please call your physician if you have any questions about your care when you get home.    DIET    Only sugar free, caffeine free, non carbonated liquids are allowed. Bariatric clear liquids for 2 weeks plus protein shakes, and then bariatric full liquids for the following week until advanced by your physician or dietician.  Make sure to take small sips all day long. Please refer to the diet guidelines provided by your dietitian. Do not eat solids.     Increase your liquid intake from a minimum of 6 to 8 cups a day as tolerated. Sip on water throughout the day. Ensure to get a minimum of 50 grams (for women) and 63 grams (men) of protein per day from your supplement.     The 30/30 rule (not drinking liquids for 30 minutes before meals or 30 minutes after meals) will apply after you are finished with your liquid diet.    Avoid foods high in sugar or fat. Maintain an adequate protein intake. Avoid nuts, popcorn, some citrus fruits, and soft breads for 2 months. Take chewable multivitamin supplement daily starting two weeks after your surgery date.     If vomiting occurs, and persists more than 24 hours, please notify your doctor.    ACTIVITY    Avoid heavy lifting (more than 10 pounds) or strenuous exercise for at least 4 weeks.    Walk daily, gradually increasing speed with a goal of 2 miles.  Ok to go up and down stairs.    MEDICATION    Follow the medication instructions as written by your doctor. Break all medications in half or crush unless advised differently from your doctor.    You should refrain from  drinking alcohol, you should refrain from driving if you are taking a narcotic medication for pain.    If the pain is unrelieved by the prescribed medication, please call you doctor.    Take all medications as prescribed.  - Please start taking your vitamins 2 weeks after surgery    - Take the Tylenol (650 mg or 20 mL) every four hours for pain control for the next 3-5 days and then as needed.  Suggested times are 7:00am, 11:00am, 3:00pm, and 7:00pm.  You can add another dose at 11:00pm if needed before bedtime . Do not exceed 4 grams (4000 mg) of tylenol total in 24 hours. The current dosing will give you 2600 mg- 3250 mg which is below the daily maximum.     - Take the gabapentin three times a day for the next 3-5 days and then as needed for pain.  This is for sharper pain.    - Take the oxycodone (or Dilaudid, whichever you were prescribed) as needed for severe pain.  This is a narcotic and has additive properties if used and used for a prolonged period of time.  You cannot operate heavy machinery (a vehicle) while on narcotics.  Contact your insurance company for official recommendations for when you can drive after discontinuing   narcotic medications.     - Take Prevacid (lansoprazole) or Prilosec (omeprazole), whichever you were prescribed, every day.  This is for acid and prevention of ulcers.  It is important to take this even if you don't feel like you're having acid reflux.    - Use Zofran (ondansetron) as needed for nausea.    - If you have received Lovenox (blood thinner), please use as instructed (2 -4 weeks)    - If you are prescribed URSODIOL (Actigall), start that medication 2 weeks after surgery.  This is for prevention of gallstones.  You can still develop gallstones but this therapy helps to limit this process with rapid weight loss.     - IF you experience constipation:   Drink at least 80 oz fluids daily  Walk regularly   Dulcolax suppository daily x3 days to get things moving   Enemas, as  needed, to soften hard stools   If at least 5 days out from surgery, may try Miralax  Once you start having softer stools, add in a fiber supplement daily (ie Metamucil, Benefiber, Citrucel-SF etc)  If you are unable to have a bowel movement and feeling abdominal pain and/or nausea/vomiting, please notify the office    INCISION    Keep your incision clean and dry. You may shower daily and pat your incision dry. No bathing or swimming for 4 weeks    Check you incision daily and notify your doctor if you notice any redness or drainage.    It is not necessary to take your temperature routinely, but if you feel like you have a fever and it is 101 degrees Farenheit or more, please call your doctor.    OTHER CARE MEASURES    Continue to use incentive spirometer 10 times per hour while awake until your next doctor visit.  Make sure you are drinking enough and urinating at least 4-6 times per day.    Notify physician with:  -fever >101  -one sided calf pain  -vomiting  -increasing abdominal pain  -sudden onset shoulder pain    MAKE SURE YOU REMAIN HYDRATED.  You can keep water with you at all times and should sip to maintain hydration.     TENTATIVE SCHEDULE:    7:00am:  Morning water 6-8oz, Breakfast, Tylenol/Gabapentin  8:00am:  Daily Medications  9:00am:  Protein Shake/Lovenox  10:00am:  Vitamins  11:00am:  Water 6-8 oz, Tylenol  12:00pm:  Lunch  1:00pm:  Water 6-8 oz  2:00pm:  Water 6-8 oz  3:00pm:  Medications (Gabapentin/Tylenol)  4:00pm:  Water 6-8 oz  5:00pm:  Dinner  6:00pm:  Water 6-8 oz  7:00pm:  Multivitamin, Tylenol  9:00pm:  Water, Gabapentin, second Lovenox (if prescribed twice daily)  11:00pm: Tylenol if needed      CONGRATULATIONS on your commitment to your success.  Fall in love with your success and JOIN US ON FACEBOOK!    This online community is designed to be a site for support to all of our POST OPERATIVE bariatric patients.  This will connect you with our staff and our patients.  Follow the process  and share your story.  YOU ARE NOT ALONE and we all need to be engaged to support and educate each other.  Welcome to the [facility].    [email or web address]

## 2021-01-04 NOTE — Transfer of Care (Signed)
Anesthesia Transfer of Care Note    Patient: Jason Ponce    Procedures performed: Procedure(s):  LAPAROSCOPIC, GASTRECTOMY SLEEVE and Omentopexy    Anesthesia type: General ETT    Patient location:Phase I PACU    Last vitals:   Vitals:    01/04/21 0847   BP: 117/68   Pulse: 82   Resp: (!) 75   Temp:    SpO2: 96%       Post pain: Patient not complaining of pain, continue current therapy      Mental Status:alert     Respiratory Function: tolerating face mask    Cardiovascular: stable    Nausea/Vomiting: patient not complaining of nausea or vomiting    Hydration Status: adequate    Post assessment: no apparent anesthetic complications   P/t VSS, spont breathing, taken to phase 1 pacu on 8 L FM, attached to monitors and o2 in pacu, report given to pacu RN, VSS and spont breathing in pacu.    Signed by: Thelma Comp, CRNA  01/04/21 8:47 AM

## 2021-01-04 NOTE — Progress Notes (Signed)
Admission Note-   Patient admitted to Surgical unit at 1030. Hand off report received from [place], Charity fundraiser.    Patient connected to [facility] monitor    Patient oriented to room, bed in lowest position, bed alarm activated, call bell within reach.    Patient informed of visitor policy.   Belongings with patient at bedside    Brief Clinical Picture:  Neuro AXOx4; Resp - O2 status WNL; Cardiac WNL    Skin Assessment  Two nurse skin assessment performed with: Marcelino Duster, RN    Areas observed with redness or injury: None    Devices present:None    Braden score <15. No    Actions taken: None    Any significant admission event None

## 2021-01-04 NOTE — Plan of Care (Signed)
Pt A&Ox4, vss. Pt walking and voiding. Pt denies nausea, vomiting. Pt reports pain moderately controlled with PRN pain medication. Pt tolerating bariatric clear liquid diet, drinking 1oz/hour at this time. Pt reeducated on how to sip slowly, and how to avoid taking in too much PO fluid.   Problem: Safety  Goal: Patient will be free from injury during hospitalization  Outcome: Progressing  Goal: Patient will be free from infection during hospitalization  Outcome: Progressing     Problem: Pain  Goal: Pain at adequate level as identified by patient  Outcome: Progressing     Problem: Side Effects from Pain Analgesia  Goal: Patient will experience minimal side effects of analgesic therapy  Outcome: Progressing     Problem: Discharge Barriers  Goal: Patient will be discharged home or other facility with appropriate resources  Outcome: Progressing     Problem: Psychosocial and Spiritual Needs  Goal: Demonstrates ability to cope with hospitalization/illness  Outcome: Progressing     Problem: Moderate/High Fall Risk Score >5  Goal: Patient will remain free of falls  Outcome: Progressing

## 2021-01-04 NOTE — Op Note (Signed)
Patient: Jason Ponce, Jason Ponce    Date: 01/04/21    Procedure date: 01/04/2021      Pre op diagnosis: Pre-Op Diagnosis Codes:     * Morbid obesity [E66.01]                               Body mass index is 51.34 kg/m.    Post op diagnosis: same    Procedure: Procedure(s):  LAPAROSCOPIC, GASTRECTOMY SLEEVE and Omentopexy                        Surgeon: Surgeon(s) and Role:     Delorse Lek, MD - Primary   Assistant: Keturah Barre CSA, who was essential and present throughout the procedure to help in   positioning, transfer, port placement, skin closure, retraction and   exposure during critical parts of the procedure, also running the camera   and scope. No qualified surgical resident was available to assist during this operation.      Estimated blood loss: minimal    Complications:none    Anaesthesia: General Endotracheal Intubation    Findings: Same    Specimen: portion of stomach    Drain:  no    Implant:   Implant Name Type Inv. Item Serial No. Manufacturer Lot No. LRB No. Used Action   [employer] SEAMGRD BIOABSRB TRI60B - [id number] Sealant [employer] SEAM[employer] BIOABSRB TRI60B  Dewitt Hoes [id number] N/A 1 Implanted   [employer] [facility] BIOABSRB TRI60B - [id number] Sealant [employer] BIOABSRB TRI60B  Dewitt Hoes [id number] N/A 1 Implanted   [employer] SE[employer] BIOABSRB TRI60P - [id number] Sealant [employer] BIOABSRB TRI60P  Dewitt Hoes [id number] N/A 2 Implanted   [employer] [facility] BIOABSRB TRI60P - [id number] Sealant [employer] SE[employer] BIOABSRB TRI60P  Dewitt Hoes [id number] N/A 1 Implanted               INDICATIONS:  Jason Ponce is a 20 y.o. morbidly obese male who has failed multiple previous attempts at conservative weight loss who opted to proceed with laparoscopic sleeve gastrectomy as a surgical weight loss option understanding all other options including gastric bypass and malabsorptive procedures and gastric banding.  After an extensive preoperative education workup, psychiatric and dietary counseling, meeting all  the preoperative clearance criteria and being cleared by medicine was cleared and scheduled for surgery. For a detailed explanation of risks please refer to the hospital and/or office chart.     DESCRIPTION OF PROCEDURE:  Jason Ponce was admitted through the preoperative holding area, received preoperative antibiotics, subcutaneous Lovenox, and compression stockings were placed.  Patient was identified by myself in the preoperative area, where surgical consent was once again obtained and all further questions and concerns were addressed; was then taken to operating room and was placed in supine position.  General endotracheal anesthesia was instituted.   Footboard was placed and pressure points were padded.  The abdomen was prepped and draped in usual customary manner.  Surgical pause was made and the procedure was confirmed and an incision was made in the supraumbilical region through which a 5-mm Optiview blunt-tipped trocar was placed under direct vision and laparoscope into the abdominal cavity using the Optiview technique.  Abdomen was insufflated and the abdominal cavity was inspected and no injuries were noted from trocar insertion.  The patient was then placed in reverse Trendelenburg position.   Bilatreral TAP blocks  Bilateral TAP blocks was established by injecting 0.5 marcaine  solution with epinephrine in the bilateral subcostal/ flank regions. Under the direct vision of the laparoscope 30 cc of the solution was injected in each side for a total of 60 ccs. Multiple dermatomes were infudsed with the solution injected and in the preperitoneal layers using a spinal needle. TAP block was established to minimize the need for post op narcotic use, improve respiratory effort and improved ambulation and patient satisfaction.   A 5-mm trocar was placed in left flank.  A 5-mm trocar was placed in between last 2 trocars.  The supraumbilical 5-mm trocar was exchanged to a 15-mm blunted trocar and a 5-mm  trocar was placed in right upper quadrant.  A Nathanson liver retractor was placed to retract left lobe of liver anteriorly.  No hiatal hernia was identified.      The greater curvature of the stomach was marked about 3-4 cm proximal to the pylorus.  The angle of His was exposed bluntly. The anterior fat pad of the stomach was removed using a LigaSure device. The greater curvature of the stomach was then mobilized starting at about 4 cm proximal to the pylorus to divide the gastrocolic ligament using the LigaSure device.  The dissection then continued proximally to divide the rest of the gastrocolic ligament, followed by gastrosplenic ligament, followed by the short gastric vessels to completely mobilize the fundus of the stomach and expose the angle of His and also the base of the left crus of the diaphragm. After completely mobilizing the stomach and dividing all the adhesions in the lesser sac a 36-French blunt-tip bougie was inserted into the stomach by the anesthesiologist.  The tip of the 36-French bougie was placed in the prepyloric region.  The stomach was then divided starting at about 3-4 cm proximal to pylorus, pointing toward and lateral to the angularis.  The stapler was fired wide at the angularis to minimize risk of stricture formation at that site.  The black endoGIA stapler was used over linear SeamGuard.  This was followed by another black 60mm load linear stapler over the linear SeamGuard to get around the angularis and point towards the angle of His.  Three more purple load linear staplers over linear SeamGuards were used to divide the stomach, marching toward the angle of His,  to complete construction of the gastric sleeve.  The last or most proximal stapler was fired just 0.5 cm or so lateral to the gastrojejunal junction.  All of the staple were fired over linear SeamGuards.  The proximal 20 cm staple line on the sleeve was then reinforced using 2 running 2-0 Vicryl sutures to completely  cover the staple line with an omental patch.  This was done after the bougie was removed. Distally, the crisscrossing of the staple lines was reinforced using an omental patch and a #2-0 Vicryl suture.  This was also done to minimize risk of kinkage of the sleeve at the angularis.  A nasogastric tube was placed in the sleeve without any difficulty. The entire sleeve staple line was submerged under a pool of saline solution and distended with oxygen flowing at 2 liters per minute through the NG tube.  After adequate distention of the sleeve was ensured under a pool of  saline solution no extravasation of air or bubbles was noted through the sleeve staple line.  This test was repeated twice, and each time adequate distention of the sleeve was ensured.  Hemostasis was ensured.  The left upper quadrant was irrigated with antibiotic-containing solution.  The irrigant  was aspirated.      The resected stomach was retrieved through thesupraumbilical trocar site.  It was passed off the table for permanent  pathology.  The fascial opening at the supraumbilical trocar site was approximated using 2 figure-of-eight #0 Vicryl suture using the Carter-Thomason fascial closure device.      The trocars were all removed under direct vision.  No bleeding was noted from any of the trocar sites.  Skin incisions were approximated #4-0 Monocryl suture.  The sterile dressing was applied.  The patient was then extubated  and taken to the post anesthesia care unit without any complications,  tolerating the procedure well.      Signed by: Delorse Lek, MD, MD    [facility]

## 2021-01-04 NOTE — Interval H&P Note (Signed)
I have reviewed the H&P, examined the patient and there are no changes.

## 2021-01-04 NOTE — Anesthesia Postprocedure Evaluation (Signed)
Anesthesia Post Evaluation    Patient: Jason Ponce    Procedure(s):  LAPAROSCOPIC, GASTRECTOMY SLEEVE and Omentopexy    Anesthesia type: general    Last Vitals:   Vitals Value Taken Time   BP 123/68 01/04/21 1020   Temp 36.2 C (97.2 F) 01/04/21 0850   Pulse 86 01/04/21 1020   Resp 20 01/04/21 1020   SpO2 72 % 01/04/21 1020                 Anesthesia Post Evaluation:     Patient Evaluated: bedside  Patient Participation: complete - patient participated  Level of Consciousness: awake and alert  Pain Score: 0  Pain Management: adequate    Airway Patency: patent    Anesthetic complications: No      PONV Status: none    Cardiovascular status: hemodynamically stable  Respiratory status: room air  Hydration status: stable        Signed by: Lucile Shutters, MD, 01/04/2021 10:33 AM

## 2021-01-05 ENCOUNTER — Encounter: Payer: Self-pay | Admitting: Surgery

## 2021-01-05 DIAGNOSIS — Z9884 Bariatric surgery status: Secondary | ICD-10-CM

## 2021-01-05 LAB — BASIC METABOLIC PANEL
Anion Gap: 13 (ref 5.0–15.0)
BUN: 3 mg/dL — ABNORMAL LOW (ref 9–28)
CO2: 19 mEq/L — ABNORMAL LOW (ref 22–29)
Calcium: 9.1 mg/dL (ref 8.8–10.8)
Chloride: 107 mEq/L (ref 100–111)
Creatinine: 0.6 mg/dL — ABNORMAL LOW (ref 0.7–1.3)
Glucose: 84 mg/dL (ref 70–100)
Potassium: 4.5 mEq/L (ref 3.5–5.1)
Sodium: 139 mEq/L (ref 136–145)

## 2021-01-05 LAB — CBC
Absolute NRBC: 0 10*3/uL (ref 0.00–0.00)
Hematocrit: 41.4 % (ref 37.6–49.6)
Hgb: 12.9 g/dL (ref 12.5–17.1)
MCH: 24.8 pg — ABNORMAL LOW (ref 25.1–33.5)
MCHC: 31.2 g/dL — ABNORMAL LOW (ref 31.5–35.8)
MCV: 79.6 fL (ref 78.0–96.0)
MPV: 12.2 fL (ref 8.9–12.5)
Nucleated RBC: 0 /100 WBC (ref 0.0–0.0)
Platelets: 300 10*3/uL (ref 142–346)
RBC: 5.2 10*6/uL (ref 4.20–5.90)
RDW: 15 % (ref 11–15)
WBC: 12.43 10*3/uL — ABNORMAL HIGH (ref 3.10–9.50)

## 2021-01-05 LAB — PHOSPHORUS: Phosphorus: 3.2 mg/dL (ref 2.3–4.7)

## 2021-01-05 LAB — CK: Creatine Kinase (CK): 355 U/L — ABNORMAL HIGH (ref 47–267)

## 2021-01-05 LAB — MAGNESIUM: Magnesium: 2 mg/dL (ref 1.6–2.6)

## 2021-01-05 MED ORDER — URSODIOL 300 MG PO CAPS
300.0000 mg | ORAL_CAPSULE | Freq: Two times a day (BID) | ORAL | 5 refills | Status: DC
Start: 2021-01-05 — End: 2021-03-08

## 2021-01-05 MED ORDER — OXYCODONE HCL 5 MG/5ML PO SOLN
5.0000 mg | ORAL | 0 refills | Status: AC | PRN
Start: 2021-01-05 — End: 2021-01-12

## 2021-01-05 MED ORDER — ACETAMINOPHEN 160 MG/5ML PO SOLN
650.0000 mg | ORAL | 0 refills | Status: DC
Start: 2021-01-05 — End: 2021-03-08

## 2021-01-05 MED ORDER — GABAPENTIN 300 MG PO CAPS
300.0000 mg | ORAL_CAPSULE | Freq: Three times a day (TID) | ORAL | 0 refills | Status: AC
Start: 2021-01-05 — End: 2021-01-10

## 2021-01-05 NOTE — Plan of Care (Signed)
Pt AOx4 on RA, VSS. BP elevated. Abd lap sites x5 CDI. Pt pain managed, denies N/V. Not passing gas. Voiding adequately. Tolerating 3-5oz/H bari clear liquids. Using IS, wearing SCDs, bed locked and in lowest position, call bell within reach.      Problem: Safety  Goal: Patient will be free from injury during hospitalization  Outcome: Adequate for Discharge  Goal: Patient will be free from infection during hospitalization  Outcome: Adequate for Discharge     Problem: Pain  Goal: Pain at adequate level as identified by patient  Outcome: Adequate for Discharge     Problem: Side Effects from Pain Analgesia  Goal: Patient will experience minimal side effects of analgesic therapy  Outcome: Adequate for Discharge     Problem: Discharge Barriers  Goal: Patient will be discharged home or other facility with appropriate resources  Outcome: Adequate for Discharge     Problem: Psychosocial and Spiritual Needs  Goal: Demonstrates ability to cope with hospitalization/illness  Outcome: Adequate for Discharge     Problem: Moderate/High Fall Risk Score >5  Goal: Patient will remain free of falls  Outcome: Adequate for Discharge     Problem: Day of Surgery- Sleeve Gastrectomy Surgery  Goal: Stable vital signs and fluid balance  Outcome: Adequate for Discharge  Goal: Pain at adequate level  Outcome: Adequate for Discharge  Goal: Effective ventilation maintained  Outcome: Adequate for Discharge  Goal: Tissue perfusion is adequate  Outcome: Adequate for Discharge  Goal: Nutritional intake is adequate  Outcome: Adequate for Discharge  Goal: Mobility/activity is maintained at optimum level  Outcome: Adequate for Discharge  Goal: Patient/patient care companion demonstrates understanding of surgery/treatment plan, medications, and discharge plans  Outcome: Adequate for Discharge     Problem: Post Op Day 1- Sleeve Gastrectomy Surgery  Goal: Stable vital signs and fluid balance  Outcome: Adequate for Discharge  Goal: Pain at adequate  level  Outcome: Adequate for Discharge  Goal: Effective ventilation maintained  Outcome: Adequate for Discharge  Goal: Tissue perfusion is adequate  Outcome: Adequate for Discharge  Goal: Nutritional Intake is adequate  Outcome: Adequate for Discharge  Goal: Mobility/activity is maintained at optimum level  Outcome: Adequate for Discharge  Goal: Patient/patient care companion demonstrates understanding of surgery/treatment plan, medications, and discharge plans  Outcome: Adequate for Discharge

## 2021-01-05 NOTE — Plan of Care (Signed)
Problem: Day of Surgery- Sleeve Gastrectomy Surgery  Goal: Stable vital signs and fluid balance  Outcome: Progressing  Flowsheets (Taken 01/05/2021 0047)  Stable vital signs and fluid balance:   Monitor vital signs   Monitor/assess O2 saturation  Note: Pt vitals remain stable. Pt is on room air and saturating high 90's. Blood pressure slightly low. Hydralazine ordered prn.   Goal: Pain at adequate level  Outcome: Progressing  Flowsheets (Taken 01/05/2021 0047)  Pain at adequate level as identified by patient:   Identify patient comfort function goal   Evaluate if patient comfort function goal is met  Note: Pt is taking dilaudid for pain with adequate pain relief.   Goal: Nutritional intake is adequate  Outcome: Progressing  Flowsheets (Taken 01/05/2021 0047)  Nutritional intake is adequate:   Monitor intake and output   Assess GI status (bowel sounds, nausea/vomiting, distention, flatus)   Provide sips of water. Do not use straws.  Note: Pt tolerating small sips of clear liquids. Levsin given prn for pain with swallowing. Pt stated the medication helped.   Goal: Mobility/activity is maintained at optimum level  Outcome: Progressing  Flowsheets (Taken 01/05/2021 0047)  Mobility/activity is maintained at optimum level:   Out of bed with assistance as needed   Ambulate at least once per shift with assistance  Note: Pt up and walking with staff stand by assistance. Pt tolerated well.

## 2021-01-05 NOTE — Progress Notes (Signed)
Pt interviewed and denies any anesthetic complications  except for sore throat & pt informed that this may last a few days.  Patient reports nasal stuffiness. Patient reports saline flush provided by nurse earlier.  + voiding

## 2021-01-05 NOTE — Progress Notes (Signed)
PROGRESS NOTE    Date Time: 01/05/2021  Patient Name: Jason Ponce    Subjective:   Feels okay. Has gas pain that radiates to shoulders. Ambulating. Tolerating bariatric liquid diet without nausea and vomiting. Doing incentive spirometry well. Voiding.     Medications:     Current Facility-Administered Medications   Medication Dose Route Frequency Last Rate Last Admin    0.9 % NaCl with KCl 20 mEq infusion   Intravenous Continuous 130 mL/hr at 01/05/21 0208 New Bag at 01/05/21 0208    acetaminophen (TYLENOL) 160 MG/5ML oral solution 650 mg  650 mg Oral Q4H        albuterol (PROVENTIL) (2.5 MG/3ML) 0.083% nebulizer solution 2.5 mg  2.5 mg Nebulization Q6H PRN        glucagon (rDNA) (GLUCAGEN) injection 1 mg  1 mg Intramuscular PRN        And    dextrose 5 % bolus 250 mL  250 mL Intravenous PRN        And    dextrose 50 % bolus 25 g  25 g Intravenous PRN        And    dextrose (D10W) 10% bolus 250 mL  25 g Intravenous PRN        diphenhydrAMINE (BENADRYL) injection 12.5 mg  12.5 mg Intravenous Q6H PRN        enoxaparin (LOVENOX) syringe 60 mg  60 mg Subcutaneous Q12H        famotidine (PEPCID) injection 20 mg  20 mg Intravenous Q12H SCH   20 mg at 01/04/21 2035    gabapentin (NEURONTIN) capsule 300 mg  300 mg Oral Q8H   300 mg at 01/05/21 0640    hydrALAZINE (APRESOLINE) injection 10 mg  10 mg Intravenous Q6H PRN        HYDROmorphone (DILAUDID) injection 0.5 mg  0.5 mg Intravenous Q3H PRN   0.5 mg at 01/05/21 0348    hyoscyamine SL (LEVSIN/SL) tablet 0.125 mg  0.125 mg Sublingual Q6H PRN   0.125 mg at 01/04/21 2048    lactated ringers infusion  75 mL/hr Intravenous Continuous   Stopped at 01/04/21 1100    lisinopril (ZESTRIL) tablet 20 mg  20 mg Oral Daily        ondansetron (ZOFRAN) injection 4 mg  4 mg Intravenous Q8H PRN   4 mg at 01/04/21 2040    oxyCODONE (ROXICODONE) 5 MG/5ML solution 5 mg  5 mg Oral Q4H PRN   5 mg at 01/05/21 0645    prochlorperazine (COMPAZINE) injection 5 mg  5 mg  Intravenous Q6H PRN        scopolamine (TRANSDERM-SCOP) patch (1.5 mg) 1 mg/72 hrs 1 patch  1 patch Transdermal Q72H PRN        simethicone (MYLICON) chewable tablet 80 mg  80 mg Oral Q6H PRN           Physical Exam:     Visit Vitals  BP 145/94   Pulse 105   Temp 99 F (37.2 C) (Oral)   Resp 20   Ht 5\' 11"    Wt (!) 368 lb 1.6 oz   SpO2 100%   BMI 51.34 kg/m         Intake and Output Summary (Last 24 hours) at Date Time    Intake/Output Summary (Last 24 hours) at 11/16/17 1308  Last data filed at 11/16/17 0118      Intake/Output Summary (Last 24 hours) at 01/05/2021 6578  Last data filed  at 01/05/2021 0557  Gross per 24 hour   Intake 4013.5 ml   Output 800 ml   Net 3213.5 ml       General appearance - alert, well appearing, and in no distress  Chest - nonlabored respirations  Heart - normal rate, regular rhythm  Abdomen - soft, nondistended, Incisions intact and appropriately tender  Extremities - no pedal edema    Labs:     Results     Procedure Component Value Units Date/Time    Magnesium [[id number]] Collected: 01/05/21 0451    Specimen: Blood Updated: 01/05/21 0538     Magnesium 2.0 mg/dL     Narrative:      If indicated    Phosphorus [[id number]] Collected: 01/05/21 0451    Specimen: Blood Updated: 01/05/21 0538     Phosphorus 3.2 mg/dL     Narrative:      If indicated    Creatine Kinase (CK) [[id number]]  (Abnormal) Collected: 01/05/21 0451    Specimen: Blood Updated: 01/05/21 0538     Creatine Kinase (CK) 355 U/L     Narrative:      If indicated    Basic Metabolic Panel [[id number]]  (Abnormal) Collected: 01/05/21 0451    Specimen: Blood Updated: 01/05/21 0538     Glucose 84 mg/dL      BUN 3 mg/dL      Creatinine 0.6 mg/dL      Calcium 9.1 mg/dL      Sodium 324 mEq/L      Potassium 4.5 mEq/L      Chloride 107 mEq/L      CO2 19 mEq/L      Anion Gap 13.0    Narrative:      If indicated    CBC without differential [[id number]]  (Abnormal) Collected: 01/05/21 0451    Specimen: Blood Updated: 01/05/21 0516     WBC  12.43 x10 3/uL      Hgb 12.9 g/dL      Hematocrit 66.4 %      Platelets 300 x10 3/uL      RBC 5.20 x10 6/uL      MCV 79.6 fL      MCH 24.8 pg      MCHC 31.2 g/dL      RDW 15 %      MPV 12.2 fL      Nucleated RBC 0.0 /100 WBC      Absolute NRBC 0.00 x10 3/uL     Narrative:      If indicated    Glucose Whole Blood - POCT [[id number]] Collected: 01/04/21 0650     Updated: 01/04/21 0838     Whole Blood Glucose POCT 91 mg/dL             Assessment:   POD # 1 S/P laparoscopic sleeve gastrectomy  Doing well    Plan:   Diet: bariatric liquid diet  Encourage ambulation/incentive spirometer and fluids  Continue oral medications  DVT prophylaxis  Discharge home this evening if meeting all milestones    Gilford Silvius, MD  Bariatric Surgery Fellow  Cell [phone]

## 2021-01-05 NOTE — Discharge Summary (Signed)
Discharge Summary    Admit Date: 01/04/2021   Discharge Date: 01/05/2021    Attending: Delorse Lek, MD   Service: SURGERY    Consults:  none     Procedures:  laparoscopic sleeve gastrectomy    Admission Diagnoses:   Morbid obesity [E66.01]    Hospital Diagnoses/Problems:   Patient Active Problem List    Diagnosis Date Noted    Benign essential hypertension 09/30/2020    Gastroesophageal reflux disease without esophagitis 09/08/2020     Added automatically from request for surgery [id number]      Morbid obesity 08/06/2020    Adult BMI 50.0-59.9 kg/sq m 08/06/2020    Attention deficit hyperactivity disorder, combined type 11/20/2018    Generalized anxiety disorder 11/07/2018    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  Post operatively patient was transferred form the PACU to the surgical floor.  Patient had an uneventful post operative period and was discharged home on post operative day 1.    At the time of discharge the patient was afebrile and vital signs were within normal limits. Ambulatory and able to void spontaneously without any difficulty. Tolerating a diet and pain was well-controlled with oral medication. Stable and ready for discharge.    Significant Diagnostic Studies:  Results       Procedure Component Value Units Date/Time    Magnesium [[id number]] Collected: 01/05/21 0451    Specimen: Blood Updated: 01/05/21 0538     Magnesium 2.0 mg/dL     Narrative:      If indicated    Phosphorus [[id number]] Collected: 01/05/21 0451    Specimen: Blood Updated: 01/05/21 0538     Phosphorus 3.2 mg/dL     Narrative:      If indicated    Creatine Kinase (CK) [[id number]]  (Abnormal) Collected: 01/05/21 0451    Specimen: Blood Updated: 01/05/21 0538     Creatine Kinase (CK) 355 U/L     Narrative:      If indicated    Basic Metabolic Panel [[id number]]  (Abnormal) Collected:  01/05/21 0451    Specimen: Blood Updated: 01/05/21 0538     Glucose 84 mg/dL      BUN 3 mg/dL      Creatinine 0.6 mg/dL      Calcium 9.1 mg/dL      Sodium 629 mEq/L      Potassium 4.5 mEq/L      Chloride 107 mEq/L      CO2 19 mEq/L      Anion Gap 13.0    Narrative:      If indicated    CBC without differential [[id number]]  (Abnormal) Collected: 01/05/21 0451    Specimen: Blood Updated: 01/05/21 0516     WBC 12.43 x10 3/uL      Hgb 12.9 g/dL      Hematocrit 01.0 %      Platelets 300 x10 3/uL      RBC 5.20 x10 6/uL      MCV 79.6 fL      MCH 24.8 pg      MCHC 31.2 g/dL      RDW 15 %      MPV 12.2 fL      Nucleated RBC 0.0 /100 WBC      Absolute NRBC 0.00 x10 3/uL  Narrative:      If indicated    Glucose Whole Blood - POCT [[id number]] Collected: 01/04/21 0650     Updated: 01/04/21 0838     Whole Blood Glucose POCT 91 mg/dL           No results found.      Treatments:  IV hydration, analgesia: multimodal, anticoagulation: LMW heparin and surgery    Discharge Exam:  General appearance: Well developed, well nourished, appears stated age and in NAD  Eyes: Sclera anicteric, pink conjunctivae, no ptosis  ENMT: mucous membranes moist, nose and ears appear normal.  Oropharynx clear.  Chest: Non labored respirations, no audible wheezing, no clubbing or cyanosis  CV:  Regular rate and rhythm, no JVD, no LE edema  Abdomen: soft, appropriately tender, non-distended, incisions cdi w/ surgical glue  Skin: Normal color and turgor, no rashes, no suspicious skin lesions noted  Neuro: No gross movement disorders noted.  Mental status: Appropriate affect, alert and oriented x 3    Disposition: Home or Self Care    Discharge Medications:     Medication List        START taking these medications      acetaminophen 160 MG/5ML solution  Commonly known as: TYLENOL  Take 20.3 mLs (650 mg total) by mouth every 4 (four) hours     gabapentin 300 MG capsule  Commonly known as: NEURONTIN  Take 1 capsule (300 mg total) by mouth every 8 (eight)  hours for 5 days     oxyCODONE 5 MG/5ML solution  Commonly known as: ROXICODONE  Take 5 mLs (5 mg total) by mouth every 4 (four) hours as needed for Pain     ursodiol 300 MG capsule  Commonly known as: ACTIGALL  Take 1 capsule (300 mg total) by mouth 2 (two) times daily  Notes to patient: Start in 2 weeks            CHANGE how you take these medications      buPROPion XL 150 MG 24 hr tablet  Commonly known as: WELLBUTRIN XL  Take 1 tablet (150 mg total) by mouth daily  What changed: when to take this  Notes to patient: Resume home schedule     lisinopril 20 MG tablet  Commonly known as: ZESTRIL  Take 1 tablet (20 mg total) by mouth daily  What changed: when to take this  Notes to patient: Resume home schedule     venlafaxine 150 MG 24 hr capsule  Commonly known as: Effexor XR  Take 1 capsule (150 mg total) by mouth daily  What changed: when to take this  Notes to patient: Resume home schedule            CONTINUE taking these medications      ALPRAZolam 0.5 MG tablet  Commonly known as: XANAX  Take 1 tablet (0.5 mg total) by mouth 3 (three) times daily as needed for Sleep or Anxiety  Notes to patient: Resume home schedule     DOXYLAMINE SUCCINATE (SLEEP) PO  Notes to patient: Resume home schedule     enoxaparin 40 MG/0.4ML syringe  Commonly known as: Lovenox  Inject 0.4 mLs (40 mg total) into the skin 2 (two) times daily for 14 days     lansoprazole 30 MG capsule  Commonly known as: PREVACID  Take 1 capsule (30 mg total) by mouth daily  Notes to patient: Resume home schedule     melatonin 3 mg tablet  Notes to patient: Resume  home schedule     ondansetron 4 MG disintegrating tablet  Commonly known as: Zofran ODT  Take 1 tablet (4 mg total) by mouth every 8 (eight) hours as needed for Nausea               Where to Get Your Medications        These medications were sent to [facility]  [address] [place] [address]      Hours: Monday - Friday 9AM to 6PM, Saturday 9AM to 3PM, closed Sunday  Phone: [phone]   acetaminophen 160 MG/5ML solution  gabapentin 300 MG capsule  oxyCODONE 5 MG/5ML solution  ursodiol 300 MG capsule         Patient Instructions:   Activity: no driving on opioid medications, no heavy lifting (more than 15lb) for 6 weeks  Diet: bariatric diet  Wound Care: keep incisions clean and dry    Follow-up with Bariatric surgery in 2 weeks.    Gilford Silvius, Bariatric Fellow

## 2021-01-05 NOTE — Consults (Signed)
**Note De-Identified  Obfuscation** Inpatient Bariatric Nutrition Education       Surgery Type: Sleeve  Surgery Date: 01/04/21    Nutrition education done by surgeon's RD pre-operatively: Yes    Reinforced nutritional guidelines: Yes    Instructed patient on adequate fluid intake with goal of drinking 64 ounces/day: Yes    Patient verbalized understanding of diet progression: Yes    Patient has home supply of protein supplements: Yes    Patient has home supply of vitamins and minerals: No    Going Home Nutrition information reviewed with patient: Yes    Educational materials/handouts given to patient: Yes    Patient verbalized comprehension of bariatric nutrition guidelines to follow upon discharge: Yes    Notes: support person present. Questions answered.

## 2021-01-05 NOTE — Progress Notes (Signed)
Asked about plan for exercise after cleared and released YES    Handed out information on post op physical activity YES    Talked about increasing number of steps YES    Gave hand out on increasing steps YES    Gave info on Post Op class and Personal Training services YES    Additional comments: N/A

## 2021-01-05 NOTE — Plan of Care (Incomplete Revision)
Pt AOx4 on RA, VSS. BP elevated. Abd lap sites x5 CDI. Pt pain managed, denies N/V. Not passing gas. Voiding adequately. Tolerating 3-5oz/H bari clear liquids. Using IS, wearing SCDs, bed locked and in lowest position, call bell within reach.    Went over [place] instructions with Pt, verbalized understanding, no questions. Removed IV x2, [place] patient. Sent with extra 1oz cups and record keeping charts.    Problem: Safety  Goal: Patient will be free from injury during hospitalization  Outcome: Adequate for Discharge  Goal: Patient will be free from infection during hospitalization  Outcome: Adequate for Discharge     Problem: Pain  Goal: Pain at adequate level as identified by patient  Outcome: Adequate for Discharge     Problem: Side Effects from Pain Analgesia  Goal: Patient will experience minimal side effects of analgesic therapy  Outcome: Adequate for Discharge     Problem: Discharge Barriers  Goal: Patient will be discharged home or other facility with appropriate resources  Outcome: Adequate for Discharge     Problem: Psychosocial and Spiritual Needs  Goal: Demonstrates ability to cope with hospitalization/illness  Outcome: Adequate for Discharge     Problem: Moderate/High Fall Risk Score >5  Goal: Patient will remain free of falls  Outcome: Adequate for Discharge     Problem: Day of Surgery- Sleeve Gastrectomy Surgery  Goal: Stable vital signs and fluid balance  Outcome: Adequate for Discharge  Goal: Pain at adequate level  Outcome: Adequate for Discharge  Goal: Effective ventilation maintained  Outcome: Adequate for Discharge  Goal: Tissue perfusion is adequate  Outcome: Adequate for Discharge  Goal: Nutritional intake is adequate  Outcome: Adequate for Discharge  Goal: Mobility/activity is maintained at optimum level  Outcome: Adequate for Discharge  Goal: Patient/patient care companion demonstrates understanding of surgery/treatment plan, medications, and discharge plans  Outcome: Adequate for Discharge      Problem: Post Op Day 1- Sleeve Gastrectomy Surgery  Goal: Stable vital signs and fluid balance  Outcome: Adequate for Discharge  Goal: Pain at adequate level  Outcome: Adequate for Discharge  Goal: Effective ventilation maintained  Outcome: Adequate for Discharge  Goal: Tissue perfusion is adequate  Outcome: Adequate for Discharge  Goal: Nutritional Intake is adequate  Outcome: Adequate for Discharge  Goal: Mobility/activity is maintained at optimum level  Outcome: Adequate for Discharge  Goal: Patient/patient care companion demonstrates understanding of surgery/treatment plan, medications, and discharge plans  Outcome: Adequate for Discharge

## 2021-01-06 LAB — LAB USE ONLY - HISTORICAL SURGICAL PATHOLOGY

## 2021-01-06 NOTE — UM Notes (Addendum)
PATIENT NAME: Jason Ponce,Jason Ponce   PATIENT DOB: August 05, 2001, 20 y.o./19 y.o., male   DISCHARGE DATE NOTIFICATION   PATIENT DISCHARGED ON: 01/05/2021 TO Home or Self Care     Is stay approved? Do you need additional info?    Please provide authorization or call with questions.        4/11  ADMIT TO INPATIENT [[id number]]      To OR Procedure: Procedure(s):  LAPAROSCOPIC, GASTRECTOMY SLEEVE and Omentopexy            Vs q4h.   I/o  bariatric clear liquid diet.                 acetaminophen (TYLENOL/OFIRMEV) injection 1,000 mg  Dose: 1,000 mg  Freq: Every 6 hours Route: IV    cefTRIAXone (ROCEPHIN) 2 g in sodium chloride 0.9 % 100 mL IVPB mini-bag plus  Dose: 2 g  Freq: Once Route: IV    enoxaparin (LOVENOX) syringe 30 mg  Dose: 30 mg  Freq: Once Route: SC    famotidine (PEPCID) injection 20 mg  Dose: 20 mg  Freq: Every 12 hours scheduled Route: IV    magnesium sulfate 1g in dextrose 5% IVPB (premix)  Dose: 1 g  Freq: Once Route: IV    0.9 % NaCl with KCl 20 mEq infusion  Rate: 130 mL/hr Freq: Continuous Route: IV    sodium chloride 0.9 % bolus 500 mL  Rate: 1,000 mL/hr Dose: 500 mL x1    HYDROmorphone (DILAUDID) injection 0.5 mg  Dose: 0.5 mg  Freq: Every 3 hours PRN Route: IV x3    ondansetron (ZOFRAN) injection 4 mg  Dose: 4 mg  Freq: Every 8 hours PRN Route: IV x1        Wynell Balloon RN CM  [facility]  [phone]    Wynell Balloon, RN, BSN, ACM  Clinical Case Manager   [facility]   [address]   [place], [place] [address]  [phone]  Direct Line  [phone] CM Department  [phone] VM for Insurer  [phone] Fax   [phone] Hospital Main Number    NPI: [id number]               Tax ID: [id number]    This clinical/utilization review is compiled from the documentation of the care team providers.

## 2021-01-12 ENCOUNTER — Ambulatory Visit (INDEPENDENT_AMBULATORY_CARE_PROVIDER_SITE_OTHER): Payer: Commercial Managed Care - POS | Admitting: Family Medicine

## 2021-01-12 NOTE — Progress Notes (Deleted)
[facility] [place] [facility]                       Date of Exam: 01/12/2021 1:19 PM        Patient ID: Jason Ponce is a 20 y.o. male.  Attending Physician: Arvilla Meres, MD        Chief Complaint:    No chief complaint on file.              HPI:    20yo M with anxiety, depression, ADHD, HTN, and morbid obesity s/p lapascopic gastric sleev on 01/04/21 here for a medication follow up            Problem List:    Patient Active Problem List   Diagnosis    Attention deficit hyperactivity disorder, combined type    Generalized anxiety disorder    Migraine    Moderately severe major depression    Morbid obesity    Adult BMI 50.0-59.9 kg/sq m    Gastroesophageal reflux disease without esophagitis    Benign essential hypertension             Current Meds:    No outpatient medications have been marked as taking for the 01/12/21 encounter (Appointment) with Arvilla Meres, MD.          Allergies:    No Known Allergies          Past Surgical History:    Past Surgical History:   Procedure Laterality Date    EGD, BIOPSY N/A 09/16/2020    Procedure: EGD, BIOPSY;  Surgeon: Tommye Standard, MD;  Location: [facility];  Service: General;  Laterality: N/A;    LAPAROSCOPIC, GASTRECTOMY SLEEVE N/A 01/04/2021    Procedure: LAPAROSCOPIC, GASTRECTOMY SLEEVE and Omentopexy;  Surgeon: Delorse Lek, MD;  Location: [facility];  Service: General;  Laterality: N/A;           Family History:    Family History   Problem Relation Age of Onset    Obesity Other     Diabetes Other     Esophageal cancer Neg Hx     Inflammatory bowel disease Neg Hx     Stomach cancer Neg Hx            Social History:    Social History     Tobacco Use    Smoking status: Never Smoker    Smokeless tobacco: Never Used   Vaping Use    Vaping Use: Never used   Substance Use Topics    Alcohol use: Never    Drug use: Not Currently     Types: Benzodiazepines     Comment: H/O Vyvanse use - currently takes  Alprazolam            The following sections were reviewed this encounter by the provider:              Vital Signs:    There were no vitals taken for this visit.         ROS:    Review of Systems           Physical Exam:    Physical Exam         Assessment:    There are no diagnoses linked to this encounter.          Plan:  Follow-up:    No follow-ups on file.         Tymeshia Awan [name], MD

## 2021-01-19 ENCOUNTER — Ambulatory Visit (INDEPENDENT_AMBULATORY_CARE_PROVIDER_SITE_OTHER): Payer: Commercial Managed Care - POS | Admitting: Registered"

## 2021-01-19 ENCOUNTER — Ambulatory Visit (INDEPENDENT_AMBULATORY_CARE_PROVIDER_SITE_OTHER): Payer: Commercial Managed Care - POS | Admitting: Family

## 2021-01-19 ENCOUNTER — Encounter (HOSPITAL_BASED_OUTPATIENT_CLINIC_OR_DEPARTMENT_OTHER): Payer: Self-pay | Admitting: Registered"

## 2021-01-19 ENCOUNTER — Ambulatory Visit (INDEPENDENT_AMBULATORY_CARE_PROVIDER_SITE_OTHER): Payer: Commercial Managed Care - POS | Admitting: Family Medicine

## 2021-01-19 ENCOUNTER — Encounter (INDEPENDENT_AMBULATORY_CARE_PROVIDER_SITE_OTHER): Payer: Self-pay | Admitting: Family Medicine

## 2021-01-19 ENCOUNTER — Encounter (HOSPITAL_BASED_OUTPATIENT_CLINIC_OR_DEPARTMENT_OTHER): Payer: Self-pay | Admitting: Family

## 2021-01-19 VITALS — BP 117/79 | HR 93 | Temp 97.0°F | Ht 71.0 in | Wt 349.0 lb

## 2021-01-19 VITALS — BP 132/80 | HR 125 | Temp 98.8°F | Ht 68.5 in | Wt 353.7 lb

## 2021-01-19 DIAGNOSIS — I10 Essential (primary) hypertension: Secondary | ICD-10-CM

## 2021-01-19 DIAGNOSIS — Z713 Dietary counseling and surveillance: Secondary | ICD-10-CM

## 2021-01-19 DIAGNOSIS — Z68.41 Body mass index (BMI) pediatric, greater than or equal to 95th percentile for age: Secondary | ICD-10-CM

## 2021-01-19 DIAGNOSIS — Z719 Counseling, unspecified: Secondary | ICD-10-CM

## 2021-01-19 DIAGNOSIS — Z9884 Bariatric surgery status: Secondary | ICD-10-CM

## 2021-01-19 DIAGNOSIS — Z903 Acquired absence of stomach [part of]: Secondary | ICD-10-CM

## 2021-01-19 DIAGNOSIS — Z6841 Body Mass Index (BMI) 40.0 and over, adult: Secondary | ICD-10-CM

## 2021-01-19 DIAGNOSIS — Z09 Encounter for follow-up examination after completed treatment for conditions other than malignant neoplasm: Secondary | ICD-10-CM

## 2021-01-19 DIAGNOSIS — Z1321 Encounter for screening for nutritional disorder: Secondary | ICD-10-CM

## 2021-01-19 NOTE — Progress Notes (Signed)
S:  Pt presents for f/u after sleeve procedure.  Pt states tolerating clear liquid diet well.  Pt reports drinking 64 oz clear fluids daily and consuming about 30-60 grams protein from supplemental sources daily.  Pt reports has not started the recommended vitamin/mineral supplements as directed.  Pt is 15 days out from surgery and has lost 19 pounds so far.  Pt reports no issues w N/V at this time.  Pt does report very watery stools, but the last few days they have been slowing improving.  He also reports the feeling of general stomach upset - bloating, air getting stuck, etc.      O:  Today's Wt:   349 lbs   Previous Wts:    Wt Readings from Last 10 Encounters:   01/04/21 (!) 368 lb 1.6 oz (>99 %, Z= 3.60)*   12/30/20 (!) 373 lb (>99 %, Z= 3.63)*   12/28/20 (!) 373 lb (>99 %, Z= 3.63)*   12/18/20 (!) 371 lb (>99 %, Z= 3.61)*   11/23/20 (!) 371 lb (>99 %, Z= 3.61)*   11/20/20 (!) 371 lb 14.4 oz (>99 %, Z= 3.61)*   10/30/20 (!) 379 lb (>99 %, Z= 3.66)*   10/23/20 (!) 378 lb 12.8 oz (>99 %, Z= 3.65)*   10/02/20 (!) 376 lb (>99 %, Z= 3.64)*   09/30/20 (!) 382 lb (>99 %, Z= 3.67)*     * Growth percentiles are based on CDC (Boys, 2-20 Years) data.    BMI:  There is no height or weight on file to calculate BMI.    A:  Pt appears to be getting adequate hydration and protein as evidenced by diet recall.  His GI symptoms need to be reviewed further with NP to evaluate whether they warrant additional treatment.  Pt verbalizes comprehension and desired compliance with next diet stage.    P:  1.  Advance diet to full liquids on 04/26 and then to soft diet on 05/02.  Diet guidelines reviewed and pt verbalized understanding and desired compliance.  2.  Pt to start all the required the vitamins/mineral supplements as directed.  3.  F/u in 6 weeks for diet advance to solids.      Spent a total of 15 minutes educating pt in a individual one-on-one setting.

## 2021-01-19 NOTE — Progress Notes (Signed)
[facility] [place] [facility]                       Date of Exam: 01/19/2021 2:29 PM        Patient ID: Jason Ponce is a 20 y.o. male.  Attending Physician: Arvilla Meres, MD        Chief Complaint:    Chief Complaint   Patient presents with   . Chronic Care Med F/up                HPI:    20yo M with morbid obesity s/p lap gastric sleeve 2 weeks ago, HTN, anxiety, depression, and ADHD here for medication follow up.   Pt states he had an uneventful lap gastric sleeve and has lost about 20lb.  He has advanced to a full liquid diet.  He hasn't had an opportunity to check his home BPs but it was noted to be normal at his specialist follow up visits.            Problem List:    Patient Active Problem List   Diagnosis   . Attention deficit hyperactivity disorder, combined type   . Generalized anxiety disorder   . Migraine   . Moderately severe major depression   . Morbid obesity   . Adult BMI 50.0-59.9 kg/sq m   . Gastroesophageal reflux disease without esophagitis   . Benign essential hypertension   . H/O gastric sleeve             Current Meds:    Outpatient Medications Marked as Taking for the 01/19/21 encounter (Office Visit) with Arvilla Meres, MD   Medication Sig Dispense Refill   . acetaminophen (TYLENOL) 160 MG/5ML solution Take 20.3 mLs (650 mg total) by mouth every 4 (four) hours 473 mL 0   . ALPRAZolam (XANAX) 0.5 MG tablet Take 1 tablet (0.5 mg total) by mouth 3 (three) times daily as needed for Sleep or Anxiety 30 tablet 1   . buPROPion XL (WELLBUTRIN XL) 150 MG 24 hr tablet Take 1 tablet (150 mg total) by mouth daily (Patient taking differently: Take 150 mg by mouth nightly) 90 tablet 1   . lansoprazole (PREVACID) 30 MG capsule Take 1 capsule (30 mg total) by mouth daily 90 capsule 0   . lisinopril (ZESTRIL) 20 MG tablet Take 1 tablet (20 mg total) by mouth daily (Patient taking differently: Take 20 mg by mouth nightly) 90 tablet 1   . ondansetron (Zofran ODT) 4 MG  disintegrating tablet Take 1 tablet (4 mg total) by mouth every 8 (eight) hours as needed for Nausea 12 tablet 1   . ursodiol (ACTIGALL) 300 MG capsule Take 1 capsule (300 mg total) by mouth 2 (two) times daily 60 capsule 5   . venlafaxine (Effexor XR) 150 MG 24 hr capsule Take 1 capsule (150 mg total) by mouth daily (Patient taking differently: Take 150 mg by mouth nightly) 90 capsule 1          Allergies:    No Known Allergies          Past Surgical History:    Past Surgical History:   Procedure Laterality Date   . EGD, BIOPSY N/A 09/16/2020    Procedure: EGD, BIOPSY;  Surgeon: Tommye Standard, MD;  Location: [facility];  Service: General;  Laterality: N/A;   . LAPAROSCOPIC, GASTRECTOMY SLEEVE N/A 01/04/2021    Procedure: LAPAROSCOPIC, GASTRECTOMY SLEEVE  and Omentopexy;  Surgeon: Delorse Lek, MD;  Location: [facility];  Service: General;  Laterality: N/A;           Family History:    Family History   Problem Relation Age of Onset   . Obesity Other    . Diabetes Other    . Esophageal cancer Neg Hx    . Inflammatory bowel disease Neg Hx    . Stomach cancer Neg Hx            Social History:    Social History     Tobacco Use   . Smoking status: Never Smoker   . Smokeless tobacco: Never Used   Vaping Use   . Vaping Use: Never used   Substance Use Topics   . Alcohol use: Never   . Drug use: Not Currently     Types: Benzodiazepines     Comment: H/O Vyvanse use - currently takes Alprazolam            The following sections were reviewed this encounter by the provider:   Tobacco  Allergies  Meds  Problems  Med Hx  Surg Hx  Fam Hx               Vital Signs:    BP 132/80 (BP Site: Left arm, Patient Position: Sitting, Cuff Size: Large)   Pulse (!) 125   Temp 98.8 F (37.1 C) (Temporal)   Ht 1.74 m (5' 8.5")   Wt (!) 160.4 kg (353 lb 11.2 oz)   BMI 53.00 kg/m          ROS:    Review of Systems   Constitutional: Negative for chills and fever.   Respiratory: Negative for shortness of breath.     Cardiovascular: Negative for chest pain and palpitations.   Gastrointestinal: Negative for abdominal pain, diarrhea and vomiting.              Physical Exam:    Physical Exam  Constitutional:       General: He is not in acute distress.     Appearance: He is obese. He is not ill-appearing.   HENT:      Head: Normocephalic and atraumatic.   Eyes:      Extraocular Movements: Extraocular movements intact.      Conjunctiva/sclera: Conjunctivae normal.      Pupils: Pupils are equal, round, and reactive to light.   Cardiovascular:      Rate and Rhythm: Normal rate and regular rhythm.   Pulmonary:      Effort: Pulmonary effort is normal.      Breath sounds: Normal breath sounds.   Abdominal:      General: Bowel sounds are normal. There is no distension.      Palpations: Abdomen is soft.      Tenderness: There is no abdominal tenderness.   Musculoskeletal:      Right lower leg: No edema.      Left lower leg: No edema.   Neurological:      Mental Status: He is alert.              Assessment:    1. Benign essential hypertension    2. Adult BMI 50.0-59.9 kg/sq m    3. H/O gastric sleeve            Plan:    Doing well post op.  BP is controlled today.  Continue lisinopril at 20mg  and log home BPs  Notify if BPs start to fall below 110 systolic            Follow-up:    No follow-ups on file.         Arlander Gillen Sloan Leiter, MD

## 2021-01-19 NOTE — Progress Notes (Signed)
Post-Operative Progress Note    Patient Name: Jason Ponce, Jason Ponce  Age: 20 y.o.  Sex: male   DOB: 11/01/2000  MRN: [id number]    Subjective:     Jason Ponce returns for postop follow up 15 days after Laparoscopic Sleeve Gastrectomy .   He feels well.   He has been tolerating liquids without problems.   He is drinking 64 oz of fluids and getting 30-60 grams of protein from supplements daily.   He has no nausea, vomiting, reflux, or abdominal pain.   He denies fever, chills, or cough.   Bowel and bladder habits are normal.   He denies calf swelling or leg pain.   He is tolerating increased activity and walking regularly.     Past Medical History:     Past Medical History:   Diagnosis Date   . Anxiety    . Attention deficit hyperactivity disorder (ADHD)    . Claustrophobia    . COVID-19 vaccine series completed     completed 2 doses Pfizer vaccine   . Depression    . Dyspnea on exertion 2014   . Gastritis     EGD 2021   . Gastroesophageal reflux disease     occasional   . Hypertension 2019    stable on meds   . Migraine 2010   . Morbid obesity     BMI 50.6        Past Surgical History:     Past Surgical History:   Procedure Laterality Date   . EGD, BIOPSY N/A 09/16/2020    Procedure: EGD, BIOPSY;  Surgeon: Tommye Standard, MD;  Location: [facility];  Service: General;  Laterality: N/A;   . LAPAROSCOPIC, GASTRECTOMY SLEEVE N/A 01/04/2021    Procedure: LAPAROSCOPIC, GASTRECTOMY SLEEVE and Omentopexy;  Surgeon: Delorse Lek, MD;  Location: [facility];  Service: General;  Laterality: N/A;       Family History:     Family History   Problem Relation Age of Onset   . Obesity Other    . Diabetes Other    . Esophageal cancer Neg Hx    . Inflammatory bowel disease Neg Hx    . Stomach cancer Neg Hx        Allergies:   No Known Allergies    Medications:     Prior to Admission medications    Medication Sig Start Date End Date Taking? Authorizing Provider   acetaminophen (TYLENOL) 160 MG/5ML solution Take 20.3 mLs  (650 mg total) by mouth every 4 (four) hours 01/05/21   Culbreath, Frederik Schmidt, MD   ALPRAZolam Prudy Feeler) 0.5 MG tablet Take 1 tablet (0.5 mg total) by mouth 3 (three) times daily as needed for Sleep or Anxiety 12/18/20   Arvilla Meres, MD   buPROPion XL (WELLBUTRIN XL) 150 MG 24 hr tablet Take 1 tablet (150 mg total) by mouth daily  Patient taking differently: Take 150 mg by mouth nightly 11/20/20   Arvilla Meres, MD   DOXYLAMINE SUCCINATE, SLEEP, PO as needed    09/15/20   [provider]   lansoprazole (PREVACID) 30 MG capsule Take 1 capsule (30 mg total) by mouth daily 11/20/20 02/18/21  Alesia Richards, FNP   lisinopril (ZESTRIL) 20 MG tablet Take 1 tablet (20 mg total) by mouth daily  Patient taking differently: Take 20 mg by mouth nightly 12/18/20 06/16/21  Arvilla Meres, MD   melatonin 3 mg tablet Take 6 mg by mouth nightly  as needed    [provider]   ondansetron (Zofran ODT) 4 MG disintegrating tablet Take 1 tablet (4 mg total) by mouth every 8 (eight) hours as needed for Nausea 12/30/20 01/19/21  Delorse Lek, MD   ursodiol (ACTIGALL) 300 MG capsule Take 1 capsule (300 mg total) by mouth 2 (two) times daily 01/05/21 07/04/21  Gaylene Brooks, MD   venlafaxine (Effexor XR) 150 MG 24 hr capsule Take 1 capsule (150 mg total) by mouth daily  Patient taking differently: Take 150 mg by mouth nightly 12/18/20   Arvilla Meres, MD       Review of Systems:     All systems reviewed and negative unless otherwise specified in the subjective.     Physical Exam:   BP 117/79 (BP Site: Left arm, Patient Position: Sitting, Cuff Size: Large)   Pulse 93   Temp 97 F (36.1 C) (Temporal)   Ht 5\' 11"    Wt (!) 349 lb   SpO2 98%   BMI 48.68 kg/m     Vitals:    01/19/21 1138   BP: 117/79   Pulse: 93   Temp: 97 F (36.1 C)   SpO2: 98%     Wt Readings from Last 10 Encounters:   01/19/21 (!) 349 lb (>99 %, Z= 3.46)*   01/04/21 (!) 368 lb 1.6 oz (>99 %, Z= 3.60)*   12/30/20 (!) 373 lb (>99 %, Z= 3.63)*   12/28/20  (!) 373 lb (>99 %, Z= 3.63)*   12/18/20 (!) 371 lb (>99 %, Z= 3.61)*   11/23/20 (!) 371 lb (>99 %, Z= 3.61)*   11/20/20 (!) 371 lb 14.4 oz (>99 %, Z= 3.61)*   10/30/20 (!) 379 lb (>99 %, Z= 3.66)*   10/23/20 (!) 378 lb 12.8 oz (>99 %, Z= 3.65)*   10/02/20 (!) 376 lb (>99 %, Z= 3.64)*     * Growth percentiles are based on CDC (Boys, 2-20 Years) data.     Appearance: comfortable, no acute distress, well nourished  HEENT: normocephalic, clear conjunctiva   Chest: clear bilaterally, regular respiratory rate, breathing unlabored; no wheeze, rhonchi, or rales  Cardiac: S1, S2, regular rate and rhythm; no murmur rub or gallop  Abdomen: soft, non-tender, no masses, non-distended, positive bowel sounds all four quadrants, all incisions healing well and clean, dry, and intact, no erythema, edges well approximated  Musculoskeletal: grossly intact ROM and motor strength, no obvious deformities  Extremities: no edema, no calf pain, no ecchymosis, no cyanosis, no clubbing  Skin: no rash or ecchymosis   Neuro: alert and oriented x 3, able to move all extremities, gait steady  Psych: calm and cooperative     Labs Reviewed:     Lab Results   Component Value Date    WBC 12.43 (H) 01/05/2021    HGB 12.9 01/05/2021    HCT 41.4 01/05/2021    MCV 79.6 01/05/2021    PLT 300 01/05/2021     '  Chemistry        Component Value Date/Time    NA 139 01/05/2021 0451    K 4.5 01/05/2021 0451    CL 107 01/05/2021 0451    CO2 19 (L) 01/05/2021 0451    BUN 3 (L) 01/05/2021 0451    CREAT 0.6 (L) 01/05/2021 0451    CREAT 0.74 (L) 05/08/2020 1411    GLU 84 01/05/2021 0451        Component Value Date/Time    CA 9.1  01/05/2021 0451    ALKPHOS 134 11/23/2020 0933    AST 29 11/23/2020 0933    ALT 59 (H) 11/23/2020 0933    BILITOTAL 0.2 11/23/2020 0933          Lab Results   Component Value Date    ALT 59 (H) 11/23/2020    AST 29 11/23/2020    ALKPHOS 134 11/23/2020    BILITOTAL 0.2 11/23/2020     Lab Results   Component Value Date    VITD 18 (L) 11/23/2020      Lab Results   Component Value Date    B12 682 11/23/2020     No results found for: VITAMINB1  No results found for: VITAMINARETI  Cholesterol   Date Value Ref Range Status   11/23/2020 161 0 - 199 mg/dL Final   16/06/9603 540 100 - 169 mg/dL Final     Triglycerides   Date Value Ref Range Status   11/23/2020 103 34 - 149 mg/dL Final   98/07/9146 66 0 - 89 mg/dL Final     HDL   Date Value Ref Range Status   11/23/2020 54 40 - 9,999 mg/dL Final     Comment:     An HDL cholesterol <40 mg/dL is low and constitutes a  coronary heart disease risk factor, and HDL-C>59 mg/dL is  a negative risk factor for CHD.  Ref: American Heart Association; Circulation 2004     05/08/2020 54 >39 mg/dL Final     Cholesterol / HDL Ratio   Date Value Ref Range Status   11/23/2020 3.0 See Below Final     Comment:     Chol/HDL Ratio:  Classification                   Male     Male  Very Low (1/2 Average Risk)      <3.4     <3.3  Low Risk                         4.0      3.8  Average Risk                     5.0      4.5  Moderate Risk (2X Average risk)  9.5      7.0  High Risk (3X Average Risk)      >23.0    >11.0     05/08/2020 2.9 0.0 - 5.0 ratio Final     Comment:                                       T. Chol/HDL Ratio                                              Men  Women                                1/2 Avg.Risk  3.4    3.3  Avg.Risk  5.0    4.4                                 2X Avg.Risk  9.6    7.1                                 3X Avg.Risk 23.4   11.0       Lab Results   Component Value Date    TSH 2.54 11/23/2020     Lab Results   Component Value Date    CA 9.1 01/05/2021    PHOS 3.2 01/05/2021     Lab Results   Component Value Date    IRON 28 (L) 11/23/2020    TIBC 276 11/23/2020     Lab Results   Component Value Date    HGBA1C 5.9 11/23/2020           Rads:   Radiological Procedure reviewed.     Radiology Results (24 Hour)     ** No results found for the last 24 hours. **         Assessment:   Status post Laparoscopic Sleeve Gastrectomy  x 15 days. No postoperative complications. Tolerating diet without difficulties.   Pathology reviewed: SURGICAL PATHOLOGY REPORT    DIAGNOSIS:       PORTION OF STOMACH, SLEEVE GASTRECTOMY:     UNREMARKABLE STOMACH, NEGATIVE FOR GASTRITIS, HELICOBACTER,    INTESTINAL     METAPLASIA OR DYSPLASIA      Plan:     1. Advance diet per Registered Dietician. Continue fluids to a goal of 64 or more oz daily. Increase protein from supplements to a goal of 60 grams daily.   2. Increase activity as tolerated.  No heavy lifting for a total of 4-6 weeks after surgery.  3. Medications reviewed. Begin vitamins as directed by by Registered Dietitian.  Begin ursodiol with the soft food diet if prescribed.  4. Follow up with PCP regularly.    5. Routine care in six weeks or sooner if needed.      Return for 6 weeks for Dietitian; 3 month visit w/labs w/Kim.    Signed by: Lonna Cobb, NP, FNP-BC    This note was generated by the [facility] EMR system/Dragon speech recognition and may contain inherent errors or omissions not intended by the user. Grammatical errors, random word insertions, deletions, pronoun errors and incomplete sentences are occasional consequences of this technology due to software limitations. Not all errors are caught or corrected. If there are questions or concerns about the content of this note or information contained within the body of this dictation they should be addressed directly with the author for clarification.

## 2021-01-25 ENCOUNTER — Other Ambulatory Visit: Payer: Self-pay

## 2021-01-25 ENCOUNTER — Encounter (HOSPITAL_COMMUNITY): Payer: Self-pay

## 2021-01-25 ENCOUNTER — Emergency Department (HOSPITAL_COMMUNITY): Payer: Managed Care, Other (non HMO)

## 2021-01-25 ENCOUNTER — Observation Stay (HOSPITAL_COMMUNITY)
Admission: EM | Admit: 2021-01-25 | Discharge: 2021-01-27 | Disposition: A | Discharge status: Home / Self Care | Payer: Managed Care, Other (non HMO) | Attending: Internal Medicine | Admitting: Internal Medicine

## 2021-01-25 ENCOUNTER — Emergency Department (HOSPITAL_BASED_OUTPATIENT_CLINIC_OR_DEPARTMENT_OTHER)
Admission: EM | Admit: 2021-01-25 | Discharge: 2021-01-25 | Discharge status: Against Medical Advice | Payer: Managed Care, Other (non HMO) | Source: Home / Self Care

## 2021-01-25 ENCOUNTER — Encounter (HOSPITAL_BASED_OUTPATIENT_CLINIC_OR_DEPARTMENT_OTHER): Payer: Self-pay

## 2021-01-25 DIAGNOSIS — K219 Gastro-esophageal reflux disease without esophagitis: Secondary | ICD-10-CM | POA: Diagnosis present

## 2021-01-25 DIAGNOSIS — Z79899 Other long term (current) drug therapy: Secondary | ICD-10-CM | POA: Insufficient documentation

## 2021-01-25 DIAGNOSIS — Z5321 Procedure and treatment not carried out due to patient leaving prior to being seen by health care provider: Secondary | ICD-10-CM | POA: Insufficient documentation

## 2021-01-25 DIAGNOSIS — Z6841 Body Mass Index (BMI) 40.0 and over, adult: Secondary | ICD-10-CM

## 2021-01-25 DIAGNOSIS — E872 Acidosis, unspecified: Secondary | ICD-10-CM | POA: Diagnosis present

## 2021-01-25 DIAGNOSIS — R7401 Elevation of levels of liver transaminase levels: Secondary | ICD-10-CM | POA: Diagnosis not present

## 2021-01-25 DIAGNOSIS — R1110 Vomiting, unspecified: Secondary | ICD-10-CM | POA: Insufficient documentation

## 2021-01-25 DIAGNOSIS — Z20822 Contact with and (suspected) exposure to covid-19: Secondary | ICD-10-CM | POA: Diagnosis not present

## 2021-01-25 DIAGNOSIS — R0789 Other chest pain: Secondary | ICD-10-CM | POA: Insufficient documentation

## 2021-01-25 DIAGNOSIS — Z9884 Bariatric surgery status: Secondary | ICD-10-CM

## 2021-01-25 DIAGNOSIS — I10 Essential (primary) hypertension: Secondary | ICD-10-CM | POA: Diagnosis not present

## 2021-01-25 DIAGNOSIS — E876 Hypokalemia: Secondary | ICD-10-CM | POA: Insufficient documentation

## 2021-01-25 DIAGNOSIS — K529 Noninfective gastroenteritis and colitis, unspecified: Secondary | ICD-10-CM | POA: Diagnosis not present

## 2021-01-25 DIAGNOSIS — R112 Nausea with vomiting, unspecified: Secondary | ICD-10-CM | POA: Diagnosis present

## 2021-01-25 DIAGNOSIS — A084 Viral intestinal infection, unspecified: Secondary | ICD-10-CM | POA: Diagnosis present

## 2021-01-25 DIAGNOSIS — E860 Dehydration: Secondary | ICD-10-CM

## 2021-01-25 DIAGNOSIS — R197 Diarrhea, unspecified: Secondary | ICD-10-CM | POA: Diagnosis present

## 2021-01-25 DIAGNOSIS — F411 Generalized anxiety disorder: Secondary | ICD-10-CM | POA: Diagnosis present

## 2021-01-25 HISTORY — DX: Morbid (severe) obesity due to excess calories: E66.01

## 2021-01-25 HISTORY — DX: Generalized anxiety disorder: F41.1

## 2021-01-25 HISTORY — DX: Essential (primary) hypertension: I10

## 2021-01-25 LAB — COMPREHENSIVE METABOLIC PANEL
ALT: 173 U/L — ABNORMAL HIGH (ref 0–44)
ALT: 179 U/L — ABNORMAL HIGH (ref 0–44)
AST: 65 U/L — ABNORMAL HIGH (ref 15–41)
AST: 66 U/L — ABNORMAL HIGH (ref 15–41)
Albumin: 4.1 g/dL (ref 3.5–5.0)
Albumin: 4 g/dL (ref 3.5–5.0)
Alkaline Phosphatase: 90 U/L (ref 38–126)
Alkaline Phosphatase: 92 U/L (ref 38–126)
Anion gap: 11 (ref 5–15)
Anion gap: 10 (ref 5–15)
BUN: 11 mg/dL (ref 6–20)
BUN: 8 mg/dL (ref 6–20)
CO2: 17 mmol/L — ABNORMAL LOW (ref 22–32)
CO2: 18 mmol/L — ABNORMAL LOW (ref 22–32)
Calcium: 9.6 mg/dL (ref 8.9–10.3)
Calcium: 9.7 mg/dL (ref 8.9–10.3)
Chloride: 105 mmol/L (ref 98–111)
Chloride: 109 mmol/L (ref 98–111)
Creatinine, Ser: 0.65 mg/dL (ref 0.61–1.24)
Creatinine, Ser: 0.8 mg/dL (ref 0.61–1.24)
GFR, Estimated: >60 mL/min (ref >60)
GFR, Estimated: >60 mL/min (ref >60)
Glucose, Bld: 129 mg/dL — ABNORMAL HIGH (ref 70–99)
Glucose, Bld: 124 mg/dL — ABNORMAL HIGH (ref 70–99)
Potassium: 3.9 mmol/L (ref 3.5–5.1)
Potassium: 3.9 mmol/L (ref 3.5–5.1)
Sodium: 133 mmol/L — ABNORMAL LOW (ref 135–145)
Sodium: 137 mmol/L (ref 135–145)
Total Bilirubin: 0.8 mg/dL (ref 0.3–1.2)
Total Bilirubin: 1 mg/dL (ref 0.3–1.2)
Total Protein: 8.7 g/dL — ABNORMAL HIGH (ref 6.5–8.1)
Total Protein: 8.3 g/dL — ABNORMAL HIGH (ref 6.5–8.1)

## 2021-01-25 LAB — CBC
HCT: 43.8 % (ref 39.0–52.0)
Hemoglobin: 14.2 g/dL (ref 13.0–17.0)
MCH: 25.8 pg — ABNORMAL LOW (ref 26.0–34.0)
MCHC: 32.4 g/dL (ref 30.0–36.0)
MCV: 79.6 fL — ABNORMAL LOW (ref 80.0–100.0)
Platelets: 278 10*3/uL (ref 150–400)
RBC: 5.5 MIL/uL (ref 4.22–5.81)
RDW: 16.1 % — ABNORMAL HIGH (ref 11.5–15.5)
WBC: 8.9 10*3/uL (ref 4.0–10.5)
nRBC: 0 % (ref 0.0–0.2)

## 2021-01-25 LAB — LIPASE, BLOOD
Lipase: 27 U/L (ref 11–51)
Lipase: 25 U/L (ref 11–51)

## 2021-01-25 LAB — CBC WITH DIFFERENTIAL/PLATELET
Abs Immature Granulocytes: 0.01 10*3/uL (ref 0.00–0.07)
Basophils Absolute: 0 10*3/uL (ref 0.0–0.1)
Basophils Relative: 0 %
Eosinophils Absolute: 0 10*3/uL (ref 0.0–0.5)
Eosinophils Relative: 0 %
HCT: 45 % (ref 39.0–52.0)
Hemoglobin: 14.2 g/dL (ref 13.0–17.0)
Immature Granulocytes: 0 %
Lymphocytes Relative: 4 %
Lymphs Abs: 0.3 10*3/uL — ABNORMAL LOW (ref 0.7–4.0)
MCH: 25.5 pg — ABNORMAL LOW (ref 26.0–34.0)
MCHC: 31.6 g/dL (ref 30.0–36.0)
MCV: 80.9 fL (ref 80.0–100.0)
Monocytes Absolute: 0.4 10*3/uL (ref 0.1–1.0)
Monocytes Relative: 6 %
Neutro Abs: 7.3 10*3/uL (ref 1.7–7.7)
Neutrophils Relative %: 90 %
Platelets: 286 10*3/uL (ref 150–400)
RBC: 5.56 MIL/uL (ref 4.22–5.81)
RDW: 16.2 % — ABNORMAL HIGH (ref 11.5–15.5)
WBC: 8 10*3/uL (ref 4.0–10.5)
nRBC: 0 % (ref 0.0–0.2)

## 2021-01-25 LAB — LACTIC ACID, PLASMA: Lactic Acid, Venous: 1.1 mmol/L (ref 0.5–1.9)

## 2021-01-25 LAB — D-DIMER, QUANTITATIVE: D-Dimer, Quant: 1.58 ug/mL-FEU — ABNORMAL HIGH (ref 0.00–0.50)

## 2021-01-25 LAB — TROPONIN I (HIGH SENSITIVITY): Troponin I (High Sensitivity): 3 ng/L (ref <18)

## 2021-01-25 MED ORDER — LACTATED RINGERS IV BOLUS
1000.0000 mL | Freq: Once | INTRAVENOUS | Status: AC
  Administered 2021-01-26: 1000 mL via INTRAVENOUS

## 2021-01-25 MED ORDER — ONDANSETRON HCL 4 MG/2ML IJ SOLN
4.0000 mg | Freq: Once | INTRAMUSCULAR | Status: AC
  Administered 2021-01-26: 4 mg via INTRAVENOUS
  Filled 2021-01-25: qty 2

## 2021-01-25 NOTE — ED Triage Notes (Signed)
Pt had gastric sleeve surgery 3 weeks ago. Pt states he started having vomiting and diarrhea two days ago. Pt c/o pain in abdomen, chest and rectal area. Pt sent here from [facility]. Pt has not been able to eat since this started.

## 2021-01-25 NOTE — ED Provider Notes (Signed)
[facility]   CSN: [id number] Arrival date & time: 01/25/21  2043     History Chief Complaint  Patient presents with  . Emesis    Vincent Roberts is a 20 y.o. male.  [place].  States he had gastric sleeve surgery on April 11.  He has been eating a soft diet and doing well but over the past 2 days he has had significant nausea, vomiting and diarrhea and abdominal pain.  Reports anytime he tries to eat he spits up green liquid cannot keep anything down.  He had nausea and dry heaving anytime he tries to vomit he also has diarrhea and loose stools.  He is not sure if they are dark or bloody.  States it feels like razor blades when he has a bowel movement.  He has had chills and body aches but no fever that he knows of.  He does have chest pain with deep breathing and worse with deep breathing.  He states he feels somewhat short of breath and has had a dry cough.  No pain with urination or blood in the urine.  Denies any black or bloody stools but he is not sure.  He states his emesis is green and not bloody.  No fever. Denies any leg pain or leg swelling.  States he saw his surgeon after the surgery and everything was going well. Has had sick contacts with GI symptoms at home.    The history is provided by the patient.  Emesis Associated symptoms: abdominal pain, arthralgias, chills, diarrhea and myalgias   Associated symptoms: no fever and no headaches        History reviewed. No pertinent past medical history.  There are no problems to display for this patient.   Past Surgical History:  Procedure Laterality Date  . ABDOMINAL SURGERY         History reviewed. No pertinent family history.  Social History   Tobacco Use  . Smoking status: Never Smoker  . Smokeless tobacco: Never Used  Vaping Use  . Vaping Use: Never used  Substance Use Topics  . Alcohol use: Never  . Drug use: Never     Home Medications Prior to Admission medications   Medication Sig Start Date End Date Taking? Authorizing Provider  ALPRAZolam Prudy Feeler(XANAX) 0.5 MG tablet Take by mouth. 12/18/20   [provider]  buPROPion (WELLBUTRIN XL) 150 MG 24 hr tablet Take 1 tablet by mouth daily. 11/20/20   [provider]  lansoprazole (PREVACID) 30 MG capsule Take 1 capsule by mouth daily. 11/20/20 02/18/21  [provider]  lisinopril (ZESTRIL) 20 MG tablet Take 1 tablet by mouth daily. 12/18/20 06/16/21  [provider]  venlafaxine XR (EFFEXOR-XR) 150 MG 24 hr capsule Take 1 capsule by mouth daily. 12/18/20   [provider]    Allergies    Patient has no known allergies.  Review of Systems   Review of Systems  Constitutional: Positive for activity change, appetite change, chills and fatigue. Negative for fever.  HENT: Negative for congestion and rhinorrhea.   Eyes: Negative for visual disturbance.  Respiratory: Positive for chest tightness and shortness of breath.   Gastrointestinal: Positive for abdominal pain, diarrhea, nausea and vomiting.  Genitourinary: Negative for dysuria and hematuria.  Musculoskeletal: Positive for arthralgias and myalgias.  Skin: Negative for wound.  Neurological: Positive for weakness. Negative for dizziness, light-headedness and headaches.   all other systems are negative except  as noted in the HPI and PMH.   Physical Exam Updated Vital Signs BP (!) 145/108 (BP Location: Left Arm)   Pulse (!) 127   Temp 98.6 F (37 C) (Oral)   Resp 15   SpO2 100%   Physical Exam Vitals and nursing note reviewed.  Constitutional:      General: He is not in acute distress.    Appearance: He is well-developed.  HENT:     Head: Normocephalic and atraumatic.     Mouth/Throat:     Pharynx: No oropharyngeal exudate.  Eyes:     Conjunctiva/sclera: Conjunctivae normal.     Pupils: Pupils are equal, round, and reactive to light.  Neck:      Comments: No meningismus. Cardiovascular:     Rate and Rhythm: Regular rhythm. Tachycardia present.     Heart sounds: Normal heart sounds. No murmur heard.     Comments: Tachycardia 120s Pulmonary:     Effort: Pulmonary effort is normal. No respiratory distress.     Breath sounds: Normal breath sounds.  Abdominal:     Palpations: Abdomen is soft.     Tenderness: There is abdominal tenderness. There is no guarding or rebound.     Comments: Surgical incisions appear to be healing well.  Mild diffuse tenderness, no guarding or rebound  Genitourinary:    Comments: No hemorrhoids, no fissures, no gross blood Musculoskeletal:        General: No tenderness. Normal range of motion.     Cervical back: Normal range of motion and neck supple.  Skin:    General: Skin is warm.  Neurological:     Mental Status: He is alert and oriented to person, place, and time.     Cranial Nerves: No cranial nerve deficit.     Motor: No abnormal muscle tone.     Coordination: Coordination normal.     Comments: No ataxia on finger to nose bilaterally. No pronator drift. 5/5 strength throughout. CN 2-12 intact.Equal grip strength. Sensation intact.   Psychiatric:        Behavior: Behavior normal.     ED Results / Procedures / Treatments   Labs (all labs ordered are listed, but only abnormal results are displayed) Labs Reviewed  COMPREHENSIVE METABOLIC PANEL - Abnormal; Notable for the following components:      Result Value   CO2 18 (*)    Glucose, Bld 124 (*)    Total Protein 8.3 (*)    AST 66 (*)    ALT 179 (*)    All other components within normal limits  CBC WITH DIFFERENTIAL/PLATELET - Abnormal; Notable for the following components:   MCH 25.5 (*)    RDW 16.2 (*)    Lymphs Abs 0.3 (*)    All other components within normal limits  D-DIMER, QUANTITATIVE - Abnormal; Notable for the following components:   D-Dimer, Quant 1.58 (*)    All other components within normal limits  RESP PANEL BY RT-PCR  (FLU A&B, COVID) ARPGX2  LIPASE, BLOOD  LACTIC ACID, PLASMA  LACTIC ACID, PLASMA  HIV ANTIBODY (ROUTINE TESTING W REFLEX)  POC OCCULT BLOOD, ED  TROPONIN I (HIGH SENSITIVITY)  TROPONIN I (HIGH SENSITIVITY)    EKG None  Radiology DG Chest 2 View  Result Date: 01/25/2021 CLINICAL DATA:  Chest pain and vomiting. EXAM: CHEST - 2 VIEW COMPARISON:  None. FINDINGS: Decreased lung volumes are seen which is likely secondary to the degree of patient inspiration. Very mild atelectasis and/or early infiltrate is  seen within the left lung base. There is no evidence of a pleural effusion or pneumothorax. The heart size and mediastinal contours are within normal limits. The visualized skeletal structures are unremarkable. IMPRESSION: Very mild left basilar atelectasis and/or early infiltrate. Electronically Signed   By: Aram Candela M.D.   On: 01/25/2021 21:16   CT Angio Chest PE W and/or Wo Contrast  Result Date: 01/26/2021 CLINICAL DATA:  Gastric sleeve procedure 3 weeks ago.  Vomiting. EXAM: CT ANGIOGRAPHY CHEST CT ABDOMEN AND PELVIS WITH CONTRAST TECHNIQUE: Multidetector CT imaging of the chest was performed using the standard protocol during bolus administration of intravenous contrast. Multiplanar CT image reconstructions and MIPs were obtained to evaluate the vascular anatomy. Multidetector CT imaging of the abdomen and pelvis was performed using the standard protocol during bolus administration of intravenous contrast. CONTRAST:  OMNIPAQUE IOHEXOL 350 MG/ML SOLN COMPARISON:  None. FINDINGS: CTA CHEST FINDINGS Cardiovascular: Contrast injection is sufficient to demonstrate satisfactory opacification of the pulmonary arteries to the segmental level. There is no pulmonary embolus. The main pulmonary artery is within normal limits for size. There is no CT evidence of acute right heart strain. The visualized aorta is normal. There is a normal 3-vessel arch branching pattern. Heart size is normal,  without pericardial effusion. Mediastinum/Nodes: No mediastinal, hilar or axillary lymphadenopathy. The visualized thyroid and thoracic esophageal course are unremarkable. Lungs/Pleura: No pulmonary nodules or masses. No pleural effusion or pneumothorax. No focal airspace consolidation. No focal pleural abnormality. Musculoskeletal: No chest wall abnormality. No acute or significant osseous findings. Review of the MIP images confirms the above findings. CT ABDOMEN and PELVIS FINDINGS Hepatobiliary: Normal hepatic contours and density. No visible biliary dilatation. Normal gallbladder. Pancreas: Normal contours without ductal dilatation. No peripancreatic fluid collection. Spleen: Normal. Adrenals/Urinary Tract: --Adrenal glands: Normal. --Right kidney/ureter: No hydronephrosis or perinephric stranding. No nephrolithiasis. No obstructing ureteral stones. --Left kidney/ureter: No hydronephrosis or perinephric stranding. No nephrolithiasis. No obstructing ureteral stones. --Urinary bladder: Unremarkable. Stomach/Bowel: --Stomach/Duodenum: Recent gastric sleeve procedure. No abnormality. --Small bowel: No dilatation or inflammation. --Colon: No focal abnormality. --Appendix: Normal. Vascular/Lymphatic: Normal course and caliber of the major abdominal vessels. No abdominal or pelvic lymphadenopathy. Reproductive: Normal prostate and seminal vesicles. Musculoskeletal. No bony spinal canal stenosis or focal osseous abnormality. Other: None. IMPRESSION: 1. No pulmonary embolus or acute aortic syndrome. 2. Recent gastric sleeve procedure without abnormality. 3. No acute abnormality of the chest, abdomen or pelvis. Electronically Signed   By: Deatra Robinson M.D.   On: 01/26/2021 00:46   CT ABDOMEN PELVIS W CONTRAST  Result Date: 01/26/2021 CLINICAL DATA:  Gastric sleeve procedure 3 weeks ago.  Vomiting. EXAM: CT ANGIOGRAPHY CHEST CT ABDOMEN AND PELVIS WITH CONTRAST TECHNIQUE: Multidetector CT imaging of the chest was  performed using the standard protocol during bolus administration of intravenous contrast. Multiplanar CT image reconstructions and MIPs were obtained to evaluate the vascular anatomy. Multidetector CT imaging of the abdomen and pelvis was performed using the standard protocol during bolus administration of intravenous contrast. CONTRAST:  OMNIPAQUE IOHEXOL 350 MG/ML SOLN COMPARISON:  None. FINDINGS: CTA CHEST FINDINGS Cardiovascular: Contrast injection is sufficient to demonstrate satisfactory opacification of the pulmonary arteries to the segmental level. There is no pulmonary embolus. The main pulmonary artery is within normal limits for size. There is no CT evidence of acute right heart strain. The visualized aorta is normal. There is a normal 3-vessel arch branching pattern. Heart size is normal, without pericardial effusion. Mediastinum/Nodes: No mediastinal, hilar or axillary lymphadenopathy.  The visualized thyroid and thoracic esophageal course are unremarkable. Lungs/Pleura: No pulmonary nodules or masses. No pleural effusion or pneumothorax. No focal airspace consolidation. No focal pleural abnormality. Musculoskeletal: No chest wall abnormality. No acute or significant osseous findings. Review of the MIP images confirms the above findings. CT ABDOMEN and PELVIS FINDINGS Hepatobiliary: Normal hepatic contours and density. No visible biliary dilatation. Normal gallbladder. Pancreas: Normal contours without ductal dilatation. No peripancreatic fluid collection. Spleen: Normal. Adrenals/Urinary Tract: --Adrenal glands: Normal. --Right kidney/ureter: No hydronephrosis or perinephric stranding. No nephrolithiasis. No obstructing ureteral stones. --Left kidney/ureter: No hydronephrosis or perinephric stranding. No nephrolithiasis. No obstructing ureteral stones. --Urinary bladder: Unremarkable. Stomach/Bowel: --Stomach/Duodenum: Recent gastric sleeve procedure. No abnormality. --Small bowel: No dilatation  or inflammation. --Colon: No focal abnormality. --Appendix: Normal. Vascular/Lymphatic: Normal course and caliber of the major abdominal vessels. No abdominal or pelvic lymphadenopathy. Reproductive: Normal prostate and seminal vesicles. Musculoskeletal. No bony spinal canal stenosis or focal osseous abnormality. Other: None. IMPRESSION: 1. No pulmonary embolus or acute aortic syndrome. 2. Recent gastric sleeve procedure without abnormality. 3. No acute abnormality of the chest, abdomen or pelvis. Electronically Signed   By: Deatra Robinson M.D.   On: 01/26/2021 00:46    Procedures Procedures   Medications Ordered in ED Medications  lactated ringers bolus 1,000 mL (has no administration in time range)  ondansetron (ZOFRAN) injection 4 mg (has no administration in time range)    ED Course  I have reviewed the triage vital signs and the nursing notes.  Pertinent labs & imaging results that were available during my care of the patient were reviewed by me and considered in my medical decision making (see chart for details).    MDM Rules/Calculators/A&P                         Patient is 3 weeks status post gastric sleeve surgery here with nausea, vomiting, diarrhea, abdominal pain and chest pain.  He is tachycardic  Labs show mild metabolic acidosis.  Creatinine is normal.  Nonspecific LFT elevation.  Patient given IV fluids and symptom control.  CT angiogram was obtained given his tachycardia and chest pain and recent surgery.  There is no pulmonary embolism or pneumonia.  S/p gastric sleeve without evidence of complication on CT scan  Remains tachycardic in the 120s despite 2 L of IV fluids.  He states he feels better in terms of his nausea and body aches. Does state he has been around several people who have had gastrointestinal symptoms as well.  Nausea and body aches have improved but patient remains tachycardic despite his IV fluid resuscitation.  He does have mild metabolic acidosis  on his labs.  CT scan is negative for pulmonary embolism or complication from his surgery.  Patient would benefit from overnight observation for continued hydration.  Discussed with Dr. Arlean Hopping.  ED ECG REPORT   Date: 01/25/2021  Rate: 118  Rhythm: normal sinus rhythm  QRS Axis: normal  Intervals: normal  ST/T Wave abnormalities: normal  Conduction Disutrbances:none  Narrative Interpretation:   Old EKG Reviewed: none available  I have personally reviewed the EKG tracing and agree with the computerized printout as noted.  Final Clinical Impression(s) / ED Diagnoses Final diagnoses:  Dehydration  Nausea vomiting and diarrhea    Rx / DC Orders ED Discharge Orders    None       Pattiann Solanki, Jeannett Senior, MD 01/26/21 [phone]

## 2021-01-25 NOTE — ED Provider Notes (Signed)
Emergency Medicine Provider Triage Evaluation Note  Vincent Roberts 20 y.o. male was evaluated in triage.  Pt complains of nausea/vomiting/diarrhea x2 days he reports some associated abdominal pain.  Also reports that he has had chest pain that started today.  Patient reports that he had gastric sleeve surgery about 3 days ago.  He states that it was a 1 day hospital stay.  He traveled down from [place] and reports that he has started having symptoms.  Chest pain started today.  He feels like it is worse with deep inspiration.  He states he has had multiple episodes of nonbloody, nonbilious vomiting.  He has also had diarrhea.  He does not know if he has had a fever.  He states his abdomen has been hurting everywhere.  He also reports some pain to his rectal area.  He has not had any urinary complaints.  Review of Systems  Positive: Chest pain, abdominal pain, nausea/vomiting/diarrhea. Negative: Fever, urinary complaints.  Physical Exam  BP 134/82   Pulse 70   Temp 98.2 F (36.8 C) (Oral)   Resp 18   Ht 5\' 4"  (1.626 m)   Wt 65.8 kg   SpO2 100%   BMI 24.89 kg/m  Gen:   Awake, no distress   HEENT:  Atraumatic  Resp:  Shallow breaths.  No evidence of respiratory distress.  Able to speak in full sentences without any difficulty. Cardiac:  Normal rate. Tachycardic.  Abd:   Nondistended, diffuse tenderness.  No rigidity, guarding MSK:   Moves extremities without difficulty Neuro:  Speech clear   Medical Decision Making  Medically screening exam initiated at 3:55 AM.  Appropriate orders placed.  Vincent Roberts was informed that the remainder of the evaluation will be completed by another provider, this initial triage assessment does not replace that evaluation, and the importance of remaining in the ED until their evaluation is complete.  Clinical Impression  Chest pain, abd pain, nausea/vomiting.    Portions of this note were generated with Levada Roberts. Dictation errors may  occur despite best attempts at proofreading.     Scientist, clinical (histocompatibility and immunogenetics), PA-C 01/25/21 2102    2103, DO 01/26/21 [id number]

## 2021-01-25 NOTE — ED Triage Notes (Signed)
Pt states that he had gastric sleeve done about 3 weeks ago, states that he started vomiting today, no PO intake today, diarrhea, dizziness as well.

## 2021-01-26 ENCOUNTER — Emergency Department (HOSPITAL_COMMUNITY): Payer: Managed Care, Other (non HMO)

## 2021-01-26 ENCOUNTER — Encounter (HOSPITAL_COMMUNITY): Payer: Self-pay | Admitting: Internal Medicine

## 2021-01-26 DIAGNOSIS — K529 Noninfective gastroenteritis and colitis, unspecified: Secondary | ICD-10-CM | POA: Diagnosis present

## 2021-01-26 DIAGNOSIS — R197 Diarrhea, unspecified: Secondary | ICD-10-CM

## 2021-01-26 DIAGNOSIS — F411 Generalized anxiety disorder: Secondary | ICD-10-CM | POA: Diagnosis not present

## 2021-01-26 DIAGNOSIS — R112 Nausea with vomiting, unspecified: Secondary | ICD-10-CM | POA: Diagnosis not present

## 2021-01-26 DIAGNOSIS — I10 Essential (primary) hypertension: Secondary | ICD-10-CM | POA: Diagnosis present

## 2021-01-26 DIAGNOSIS — E872 Acidosis, unspecified: Secondary | ICD-10-CM | POA: Diagnosis present

## 2021-01-26 DIAGNOSIS — R7401 Elevation of levels of liver transaminase levels: Secondary | ICD-10-CM

## 2021-01-26 DIAGNOSIS — K219 Gastro-esophageal reflux disease without esophagitis: Secondary | ICD-10-CM | POA: Diagnosis not present

## 2021-01-26 LAB — COMPREHENSIVE METABOLIC PANEL
ALT: 145 U/L — ABNORMAL HIGH (ref 0–44)
AST: 61 U/L — ABNORMAL HIGH (ref 15–41)
Albumin: 3.2 g/dL — ABNORMAL LOW (ref 3.5–5.0)
Alkaline Phosphatase: 79 U/L (ref 38–126)
Anion gap: 8 (ref 5–15)
BUN: 7 mg/dL (ref 6–20)
CO2: 23 mmol/L (ref 22–32)
Calcium: 9.2 mg/dL (ref 8.9–10.3)
Chloride: 106 mmol/L (ref 98–111)
Creatinine, Ser: 0.83 mg/dL (ref 0.61–1.24)
GFR, Estimated: >60 mL/min (ref >60)
Glucose, Bld: 109 mg/dL — ABNORMAL HIGH (ref 70–99)
Potassium: 3.9 mmol/L (ref 3.5–5.1)
Sodium: 137 mmol/L (ref 135–145)
Total Bilirubin: 0.9 mg/dL (ref 0.3–1.2)
Total Protein: 7.1 g/dL (ref 6.5–8.1)

## 2021-01-26 LAB — HEPATITIS PANEL, ACUTE
HCV Ab: NONREACTIVE
Hep A IgM: NONREACTIVE
Hep B C IgM: NONREACTIVE
Hepatitis B Surface Ag: NONREACTIVE

## 2021-01-26 LAB — RESP PANEL BY RT-PCR (FLU A&B, COVID) ARPGX2
Influenza A by PCR: NEGATIVE
Influenza B by PCR: NEGATIVE
SARS Coronavirus 2 by RT PCR: NEGATIVE

## 2021-01-26 LAB — URINALYSIS, ROUTINE W REFLEX MICROSCOPIC
Bacteria, UA: NONE SEEN
Bilirubin Urine: NEGATIVE
Glucose, UA: NEGATIVE mg/dL
Hgb urine dipstick: NEGATIVE
Ketones, ur: 20 mg/dL — AB
Leukocytes,Ua: NEGATIVE
Nitrite: NEGATIVE
Protein, ur: 30 mg/dL — AB
Specific Gravity, Urine: >1.046 — ABNORMAL HIGH (ref 1.005–1.030)
pH: 5 (ref 5.0–8.0)

## 2021-01-26 LAB — CBC
HCT: 39.7 % (ref 39.0–52.0)
Hemoglobin: 12.5 g/dL — ABNORMAL LOW (ref 13.0–17.0)
MCH: 25.9 pg — ABNORMAL LOW (ref 26.0–34.0)
MCHC: 31.5 g/dL (ref 30.0–36.0)
MCV: 82.4 fL (ref 80.0–100.0)
Platelets: 243 10*3/uL (ref 150–400)
RBC: 4.82 MIL/uL (ref 4.22–5.81)
RDW: 16.4 % — ABNORMAL HIGH (ref 11.5–15.5)
WBC: 7.2 10*3/uL (ref 4.0–10.5)
nRBC: 0 % (ref 0.0–0.2)

## 2021-01-26 LAB — RAPID URINE DRUG SCREEN, HOSP PERFORMED
Amphetamines: NOT DETECTED
Barbiturates: NOT DETECTED
Benzodiazepines: NOT DETECTED
Cocaine: NOT DETECTED
Opiates: NOT DETECTED
Tetrahydrocannabinol: NOT DETECTED

## 2021-01-26 LAB — TROPONIN I (HIGH SENSITIVITY): Troponin I (High Sensitivity): 3 ng/L (ref <18)

## 2021-01-26 LAB — LACTIC ACID, PLASMA: Lactic Acid, Venous: 1.1 mmol/L (ref 0.5–1.9)

## 2021-01-26 LAB — TSH: TSH: 1.135 u[IU]/mL (ref 0.350–4.500)

## 2021-01-26 LAB — HIV ANTIBODY (ROUTINE TESTING W REFLEX): HIV Screen 4th Generation wRfx: NONREACTIVE

## 2021-01-26 LAB — MAGNESIUM: Magnesium: 1.5 mg/dL — ABNORMAL LOW (ref 1.7–2.4)

## 2021-01-26 MED ORDER — PANTOPRAZOLE SODIUM 40 MG IV SOLR
40.0000 mg | Freq: Once | INTRAVENOUS | Status: AC
  Administered 2021-01-26: 40 mg via INTRAVENOUS
  Filled 2021-01-26: qty 40

## 2021-01-26 MED ORDER — PANTOPRAZOLE SODIUM 40 MG IV SOLR
40.0000 mg | INTRAVENOUS | Status: DC
  Administered 2021-01-26 – 2021-01-27 (×2): 40 mg via INTRAVENOUS
  Filled 2021-01-26 (×2): qty 40

## 2021-01-26 MED ORDER — ADULT MULTIVITAMIN W/MINERALS CH
1.0000 | ORAL_TABLET | Freq: Every day | ORAL | Status: DC
  Administered 2021-01-26 – 2021-01-27 (×2): 1 via ORAL
  Filled 2021-01-26 (×2): qty 1

## 2021-01-26 MED ORDER — FLUTICASONE PROPIONATE 50 MCG/ACT NA SUSP: 1.0000 | Freq: Every day | NASAL | Status: DC | PRN

## 2021-01-26 MED ORDER — HYDROMORPHONE HCL 1 MG/ML IJ SOLN: 0.5000 mg | INTRAMUSCULAR | Status: DC | PRN

## 2021-01-26 MED ORDER — ACETAMINOPHEN 325 MG PO TABS
650.0000 mg | ORAL_TABLET | Freq: Once | ORAL | Status: AC
  Administered 2021-01-26: 650 mg via ORAL
  Filled 2021-01-26: qty 2

## 2021-01-26 MED ORDER — LORAZEPAM 2 MG/ML IJ SOLN
1.0000 mg | INTRAMUSCULAR | Status: DC | PRN
  Administered 2021-01-26: 1 mg via INTRAVENOUS
  Filled 2021-01-26: qty 1

## 2021-01-26 MED ORDER — IOHEXOL 350 MG/ML SOLN
100.0000 mL | Freq: Once | INTRAVENOUS | Status: AC | PRN
  Administered 2021-01-26: 100 mL via INTRAVENOUS

## 2021-01-26 MED ORDER — ONDANSETRON HCL 4 MG/2ML IJ SOLN
4.0000 mg | Freq: Four times a day (QID) | INTRAMUSCULAR | Status: DC | PRN
  Administered 2021-01-26 – 2021-01-27 (×3): 4 mg via INTRAVENOUS
  Filled 2021-01-26 (×3): qty 2

## 2021-01-26 MED ORDER — ACETAMINOPHEN 650 MG RE SUPP: 650.0000 mg | Freq: Four times a day (QID) | RECTAL | Status: DC | PRN

## 2021-01-26 MED ORDER — LOPERAMIDE HCL 2 MG PO CAPS
4.0000 mg | ORAL_CAPSULE | Freq: Once | ORAL | Status: AC
  Administered 2021-01-26: 4 mg via ORAL
  Filled 2021-01-26: qty 2

## 2021-01-26 MED ORDER — HYDROCORTISONE (PERIANAL) 2.5 % EX CREA
TOPICAL_CREAM | Freq: Three times a day (TID) | CUTANEOUS | Status: DC | PRN
  Administered 2021-01-26: 1 via RECTAL
  Filled 2021-01-26: qty 28.35

## 2021-01-26 MED ORDER — MAGNESIUM SULFATE 4 GM/100ML IV SOLN
4.0000 g | Freq: Once | INTRAVENOUS | Status: AC
  Administered 2021-01-26: 4 g via INTRAVENOUS
  Filled 2021-01-26: qty 100

## 2021-01-26 MED ORDER — LISINOPRIL 20 MG PO TABS
20.0000 mg | ORAL_TABLET | Freq: Every day | ORAL | Status: DC
  Administered 2021-01-26: 20 mg via ORAL
  Filled 2021-01-26: qty 1

## 2021-01-26 MED ORDER — LACTATED RINGERS IV SOLN: INTRAVENOUS | Status: DC

## 2021-01-26 MED ORDER — ACETAMINOPHEN 325 MG PO TABS
650.0000 mg | ORAL_TABLET | Freq: Four times a day (QID) | ORAL | Status: DC | PRN
  Administered 2021-01-26 (×3): 650 mg via ORAL
  Filled 2021-01-26 (×3): qty 2

## 2021-01-26 NOTE — Progress Notes (Signed)
Patient admitted earlier this morning.  H&P reviewed.  Patient seen and examined.  Patient mentions that he is feeling slightly better today compared to yesterday.  Denies any abdominal pain currently.  No vomiting or diarrhea since yesterday.  He was experiencing rectal pain when he was having diarrhea yesterday and requesting something for this if it were to occur again.  Lives in [place] and was visiting friends/family.  Vital signs reviewed.  Temperature noted to be 99.6.  Heart rate was initially in the 120s.  Currently in the early 100s.  Other vital signs are stable.  Lungs are clear to auscultation bilaterally. S1-S2 is normal regular.  No S3-S4. Abdomen is soft.  Nontender nondistended. Rectal examination does not show any obvious lesions in the rectal area.  Magnesium noted to be 1.5.  LFTs noted to be mildly abnormal.  TSH 1.13.  Hepatitis panel unremarkable.  UA does not suggest infection.  Urine drug screen unremarkable.  CT abdomen pelvis report reviewed.  No acute findings noted.  No PE was noted on CT angio chest.  Patient mentions that her symptoms started about 24 to 48 hours prior to hospitalization.  He has had multiple family members and friends who have also been sick with GI symptoms.  He ate some quesadilla from a cookout diner recently.  Patient with history of gastric sleeve that was placed in the hospital in [place] about 3 weeks ago.  CT scan was reassuring.  Abnormal LFTs likely due to viral syndrome.  Noted to be stable.  Hepatitis panel unremarkable.  Continue to trend.  His symptoms are more consistent with acute gastroenteritis, likely viral in etiology.  Treat him supportively for now.  Replace his magnesium.  Abdomen is benign.  Imodium as needed if he has persistent diarrhea.  Mobilize.  Patient was reassured.  Other issues as per H&P.  Osvaldo Shipper 01/26/2021

## 2021-01-26 NOTE — Progress Notes (Signed)
Tylenol given

## 2021-01-26 NOTE — Plan of Care (Signed)

## 2021-01-26 NOTE — ED Notes (Signed)
Pt states nausea is better.  

## 2021-01-26 NOTE — Progress Notes (Signed)
HOSPITAL MEDICINE OVERNIGHT EVENT NOTE    Notified by nursing the patient is requesting an antidiarrheal.  I spoke to the patient.  He states that his vomiting has ceased that his diarrhea is actually slowing down.  Patient denies abdominal pain, fever and denies having blood in his stools.    Based on my review of the chart there is no evidence of leukocytosis.  Daytime providers feel that this is likely viral.   Since patient's symptoms are quite mild without any concerning objective findings we will go ahead and order a one-time dose of loperamide per his request.  Marinda Elk  MD [facility]

## 2021-01-26 NOTE — ED Notes (Signed)
Patient transported to CT 

## 2021-01-26 NOTE — H&P (Signed)
History and Physical    PLEASE NOTE THAT DRAGON DICTATION SOFTWARE WAS USED IN THE CONSTRUCTION OF THIS NOTE.   Vincent Roberts WGN:[id number] DOB: 19-Jan-2001 DOA: 01/25/2021  PCP: System, Provider Not InPatient coming from: home   I have personally briefly reviewed patient's old medical records in [place]  Chief Complaint: Nausea/vomiting  HPI: Vincent Roberts is a 20 y.o. male with medical history significant for recent gastric sleeve procedure, generalized anxiety disorder, hypertension, GERD who is admitted to [facility] on 01/25/2021 with intractable nausea/vomiting after presenting from home to [facility] ED complaining of nausea/vomiting.   The patient conveys that he underwent gastric sleeve procedure on January 04, 2021 in [place].  Initially, postoperatively he reports an uncomplicated course, prompting him to begin traveling again and visiting friends in [place].  When he arrived to visit these friends, he was ultimately told that they had been experiencing nausea, vomiting, and diarrhea over the last few days has been told that he likely had a "GI bug".  Within 1 day of this contact, the patient reports development of intermittent nausea resulting in at least 10 episodes of nonbloody, nonbilious and emesis over the last 2 days.  He also notes to 3 episodes of watery, nonbloody loose stool, that started approximately 12 hours after the initial episode of vomiting.  Not associate with any melena or hematochezia.  After the first several episodes of vomiting, he reports development of intermittent crampy nonradiating abdominal discomfort, that worsens with additional episodes of vomiting as well as with palpation of the abdomen.  He conveys that in this capacity has been unable to tolerate any oral intake over the last 36 hours, and consequently unable to take any of his outpatient oral medications over that timeframe.  Denies any associated subjective fever, chills, rigors, or  generalized myalgias.  No rash.  Not associate with any dysuria, gross hematuria, or change in urinary urgency/frequency.  Not associate with any chest pain, palpitations, diaphoresis, shortness of breath, dizziness, presyncope, or syncope.  No known COVID-19 exposures.     ED Course:  Vital signs in the ED were notable for the following: Tetramex 98.6, heart rate 110 126; blood pressure 145/92; respiratory rate 16-26, and oxygen saturation 96-100 percent on room air.  Labs were notable for the following: CMP was notable for the following: Initial serum sodium 133, with repeat value trending up to 135 following interval IV fluids, as further described below, bicarbonate 17, anion gap 11, creatinine 0.65.  Liver enzymes were notable for the following albumin 4.1, alk phos 90, AST 65, ALT 173, total bilirubin 0.8.  CBC notable for white blood cell count 9000.  Lactic acid x2 checks were found to be identical at 1.1.  D-dimer elevated 1.58.  Screening COVID-19 PCR obtained in the ED this evening, with result currently pending.  Chest x-ray showed mild left basilar opacity favoring atelectasis.  This was further evaluated, particular in the setting of elevated D-dimer, by pursuing CTA chest as well as CT abdomen/pelvis which showed no evidence of acute pulmonary embolism, no evidence of acute cardio or pulmonary process, including no evidence of infiltrate or edema.  While CT abdomen/pelvis showed no evidence of acute intra-abdominal process, including no evidence of bowel obstruction, perforation, or abscess.  Recent gastric sleeve procedure was noted, without overt abnormality radiographically.  While in the ED, the following were administered: Acetaminophen 650 mg p.o. x1, Zofran 4 mg IV x1, Protonix 40 mg IV x1, and a 2 L lactated  Ringer bolus.  Subsequently, the patient was admitted to the med telemetry floor for overnight observation for further evaluation and management of intractable  nausea/vomiting and associated inability to tolerate p.o.     Review of Systems: As per HPI otherwise 10 point review of systems negative.   Past Medical History:  Diagnosis Date  . GAD (generalized anxiety disorder)   . HTN (hypertension)   . 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 ABDOMINAL SURGERY      Social History:  reports that he has never smoked. He has never used smokeless tobacco. He reports that he does not drink alcohol and does not use drugs.   No Known Allergies  History reviewed. No pertinent family history.    Prior to Admission medications   Medication Sig Start Date End Date Taking? Authorizing Provider  ALPRAZolam Duanne Moron) 0.5 MG tablet Take 0.5 mg by mouth 3 (three) times daily as needed for anxiety. 12/18/20  Yes [provider]  buPROPion (WELLBUTRIN XL) 150 MG 24 hr tablet Take 150 mg by mouth at bedtime. 11/20/20  Yes [provider]  fluticasone (FLONASE) 50 MCG/ACT nasal spray Place 1 spray into both nostrils daily as needed for allergies. 02/08/16  Yes [provider]  lansoprazole (PREVACID) 30 MG capsule Take 30 mg by mouth at bedtime. 11/20/20 02/18/21 Yes [provider]  lisinopril (ZESTRIL) 20 MG tablet Take 20 mg by mouth at bedtime. 12/18/20 06/16/21 Yes [provider]  Multiple Vitamin (MULTIVITAMIN) tablet Take 1 tablet by mouth daily.   Yes [provider]  ondansetron (ZOFRAN-ODT) 4 MG disintegrating tablet Take 4 mg by mouth every 8 (eight) hours as needed for vomiting or nausea. 01/04/21  Yes [provider]  ursodiol (ACTIGALL) 300 MG capsule Take 300 mg by mouth 2 (two) times daily. 01/05/21  Yes [provider]  venlafaxine XR (EFFEXOR-XR) 150 MG 24 hr capsule Take 150 mg by mouth at bedtime. 12/18/20  Yes [provider]  enoxaparin (LOVENOX) 40 MG/0.4ML injection  Inject 40 mg into the skin See admin instructions. Bid x 14 days Patient not taking: Reported on 01/26/2021 12/30/20   [provider]  gabapentin (NEURONTIN) 300 MG capsule Take 300 mg by mouth 3 (three) times daily. Patient not taking: Reported on 01/26/2021 01/05/21   [provider]     Objective    Physical Exam: Vitals:   01/25/21 2100 01/25/21 2330 01/26/21 0039 01/26/21 0115  BP: (!) 145/108 (!) 157/104 (!) 145/92 (!) 147/78  Pulse: (!) 127 (!) 110 (!) 122 (!) 126  Resp: 15 16 (!) 25 (!) 26  Temp: 98.6 F (37 C)     TempSrc: Oral     SpO2: 100% 98% 94% 96%    General: appears to be stated age; alert, oriented Skin: warm, dry, no rash Head:  AT/[place] Mouth:  Oral mucosa membranes appear dry, normal dentition Neck: supple; trachea midline Heart:  RRR; did not appreciate any M/R/G Lungs: CTAB, did not appreciate any wheezes, rales, or rhonchi Abdomen: + BS; soft, ND, mild tenderness, most notably over the epigastrium, in the absence of any associated guarding, rigidity, or rebound tenderness. Vascular: 2+ pedal pulses b/l; 2+ radial pulses b/l Extremities: no peripheral edema, no muscle wasting Neuro: strength and sensation intact in upper and lower extremities b/l     Labs on Admission: I have personally reviewed  following labs and imaging studies  CBC: Recent Labs  Lab 01/25/21 1813 01/25/21 2113  WBC 8.9 8.0  NEUTROABS  --  7.3  HGB 14.2 14.2  HCT 43.8 45.0  MCV 79.6* 80.9  PLT 278 297   Basic Metabolic Panel: Recent Labs  Lab 01/25/21 1813 01/25/21 2113  NA 133* 137  K 3.9 3.9  CL 105 109  CO2 17* 18*  GLUCOSE 129* 124*  BUN 11 8  CREATININE 0.65 0.80  CALCIUM 9.6 9.7   GFR: Estimated Creatinine Clearance: 226.4 mL/min (by C-G formula based on SCr of 0.8 mg/dL). Liver Function Tests: Recent Labs  Lab 01/25/21 1813 01/25/21 2113  AST 65* 66*  ALT 173* 179*  ALKPHOS 90 92  BILITOT 0.8 1.0  PROT 8.7* 8.3*  ALBUMIN 4.1 4.0    Recent Labs  Lab 01/25/21 1813 01/25/21 2113  LIPASE 27 25   No results for input(s): AMMONIA in the last 168 hours. Coagulation Profile: No results for input(s): INR, PROTIME in the last 168 hours. Cardiac Enzymes: No results for input(s): CKTOTAL, CKMB, CKMBINDEX, TROPONINI in the last 168 hours. BNP (last 3 results) No results for input(s): PROBNP in the last 8760 hours. HbA1C: No results for input(s): HGBA1C in the last 72 hours. CBG: No results for input(s): GLUCAP in the last 168 hours. Lipid Profile: No results for input(s): CHOL, HDL, LDLCALC, TRIG, CHOLHDL, LDLDIRECT in the last 72 hours. Thyroid Function Tests: No results for input(s): TSH, T4TOTAL, FREET4, T3FREE, THYROIDAB in the last 72 hours. Anemia Panel: No results for input(s): VITAMINB12, FOLATE, FERRITIN, TIBC, IRON, RETICCTPCT in the last 72 hours. Urine analysis: No results found for: COLORURINE, APPEARANCEUR, [place], [place], GLUCOSEU, HGBUR, BILIRUBINUR, KETONESUR, PROTEINUR, UROBILINOGEN, NITRITE, LEUKOCYTESUR  Radiological Exams on Admission: DG Chest 2 View  Result Date: 01/25/2021 CLINICAL DATA:  Chest pain and vomiting. EXAM: CHEST - 2 VIEW COMPARISON:  None. FINDINGS: Decreased lung volumes are seen which is likely secondary to the degree of patient inspiration. Very mild atelectasis and/or early infiltrate is seen within the left lung base. There is no evidence of a pleural effusion or pneumothorax. The heart size and mediastinal contours are within normal limits. The visualized skeletal structures are unremarkable. IMPRESSION: Very mild left basilar atelectasis and/or early infiltrate. Electronically Signed   By: Virgina Norfolk M.D.   On: 01/25/2021 21:16   CT Angio Chest PE W and/or Wo Contrast  Result Date: 01/26/2021 CLINICAL DATA:  Gastric sleeve procedure 3 weeks ago.  Vomiting. EXAM: CT ANGIOGRAPHY CHEST CT ABDOMEN AND PELVIS WITH CONTRAST TECHNIQUE: Multidetector CT imaging of the chest was  performed using the standard protocol during bolus administration of intravenous contrast. Multiplanar CT image reconstructions and MIPs were obtained to evaluate the vascular anatomy. Multidetector CT imaging of the abdomen and pelvis was performed using the standard protocol during bolus administration of intravenous contrast. CONTRAST:  122m OMNIPAQUE IOHEXOL 350 MG/ML SOLN COMPARISON:  None. FINDINGS: CTA CHEST FINDINGS Cardiovascular: Contrast injection is sufficient to demonstrate satisfactory opacification of the pulmonary arteries to the segmental level. There is no pulmonary embolus. The main pulmonary artery is within normal limits for size. There is no CT evidence of acute right heart strain. The visualized aorta is normal. There is a normal 3-vessel arch branching pattern. Heart size is normal, without pericardial effusion. Mediastinum/Nodes: No mediastinal, hilar or axillary lymphadenopathy. The visualized thyroid and thoracic esophageal course are unremarkable. Lungs/Pleura: No pulmonary nodules or masses. No pleural effusion or pneumothorax. No focal airspace consolidation.  No focal pleural abnormality. Musculoskeletal: No chest wall abnormality. No acute or significant osseous findings. Review of the MIP images confirms the above findings. CT ABDOMEN and PELVIS FINDINGS Hepatobiliary: Normal hepatic contours and density. No visible biliary dilatation. Normal gallbladder. Pancreas: Normal contours without ductal dilatation. No peripancreatic fluid collection. Spleen: Normal. Adrenals/Urinary Tract: --Adrenal glands: Normal. --Right kidney/ureter: No hydronephrosis or perinephric stranding. No nephrolithiasis. No obstructing ureteral stones. --Left kidney/ureter: No hydronephrosis or perinephric stranding. No nephrolithiasis. No obstructing ureteral stones. --Urinary bladder: Unremarkable. Stomach/Bowel: --Stomach/Duodenum: Recent gastric sleeve procedure. No abnormality. --Small bowel: No dilatation  or inflammation. --Colon: No focal abnormality. --Appendix: Normal. Vascular/Lymphatic: Normal course and caliber of the major abdominal vessels. No abdominal or pelvic lymphadenopathy. Reproductive: Normal prostate and seminal vesicles. Musculoskeletal. No bony spinal canal stenosis or focal osseous abnormality. Other: None. IMPRESSION: 1. No pulmonary embolus or acute aortic syndrome. 2. Recent gastric sleeve procedure without abnormality. 3. No acute abnormality of the chest, abdomen or pelvis. Electronically Signed   By: Ulyses Jarred M.D.   On: 01/26/2021 00:46   CT ABDOMEN PELVIS W CONTRAST  Result Date: 01/26/2021 CLINICAL DATA:  Gastric sleeve procedure 3 weeks ago.  Vomiting. EXAM: CT ANGIOGRAPHY CHEST CT ABDOMEN AND PELVIS WITH CONTRAST TECHNIQUE: Multidetector CT imaging of the chest was performed using the standard protocol during bolus administration of intravenous contrast. Multiplanar CT image reconstructions and MIPs were obtained to evaluate the vascular anatomy. Multidetector CT imaging of the abdomen and pelvis was performed using the standard protocol during bolus administration of intravenous contrast. CONTRAST:  135m OMNIPAQUE IOHEXOL 350 MG/ML SOLN COMPARISON:  None. FINDINGS: CTA CHEST FINDINGS Cardiovascular: Contrast injection is sufficient to demonstrate satisfactory opacification of the pulmonary arteries to the segmental level. There is no pulmonary embolus. The main pulmonary artery is within normal limits for size. There is no CT evidence of acute right heart strain. The visualized aorta is normal. There is a normal 3-vessel arch branching pattern. Heart size is normal, without pericardial effusion. Mediastinum/Nodes: No mediastinal, hilar or axillary lymphadenopathy. The visualized thyroid and thoracic esophageal course are unremarkable. Lungs/Pleura: No pulmonary nodules or masses. No pleural effusion or pneumothorax. No focal airspace consolidation. No focal pleural abnormality.  Musculoskeletal: No chest wall abnormality. No acute or significant osseous findings. Review of the MIP images confirms the above findings. CT ABDOMEN and PELVIS FINDINGS Hepatobiliary: Normal hepatic contours and density. No visible biliary dilatation. Normal gallbladder. Pancreas: Normal contours without ductal dilatation. No peripancreatic fluid collection. Spleen: Normal. Adrenals/Urinary Tract: --Adrenal glands: Normal. --Right kidney/ureter: No hydronephrosis or perinephric stranding. No nephrolithiasis. No obstructing ureteral stones. --Left kidney/ureter: No hydronephrosis or perinephric stranding. No nephrolithiasis. No obstructing ureteral stones. --Urinary bladder: Unremarkable. Stomach/Bowel: --Stomach/Duodenum: Recent gastric sleeve procedure. No abnormality. --Small bowel: No dilatation or inflammation. --Colon: No focal abnormality. --Appendix: Normal. Vascular/Lymphatic: Normal course and caliber of the major abdominal vessels. No abdominal or pelvic lymphadenopathy. Reproductive: Normal prostate and seminal vesicles. Musculoskeletal. No bony spinal canal stenosis or focal osseous abnormality. Other: None. IMPRESSION: 1. No pulmonary embolus or acute aortic syndrome. 2. Recent gastric sleeve procedure without abnormality. 3. No acute abnormality of the chest, abdomen or pelvis. Electronically Signed   By: KUlyses JarredM.D.   On: 01/26/2021 00:46      Assessment/Plan   Fread GCrispois a 20y.o. male with medical history significant for recent gastric sleeve procedure, generalized anxiety sorter, hypertension, GERD [facility]/vomiting after presenting from home to  [facility] ED complaining of nausea/vomiting.    Principal Problem:   Nausea & vomiting Active Problems:   Diarrhea   Metabolic acidosis   HTN (hypertension)   GAD (generalized anxiety disorder)   Transaminitis   GERD (gastroesophageal reflux disease)    #)  Nausea/vomiting/diarrhea: At least 10 episodes of nonbloody, nonbilious emesis over the last day, limiting his oral intake over that time, preventing him from taking his p.o. outpatient medications.  The timing of onset of the symptoms as well as sequence ultimate development of loose stool, is suspicious for gastroenteritis as a consequence of exposure to relatives the symptoms preceding his development of them.  He demonstrates clinical evidence of dehydration as a consequence of these GI losses.  As I suspect that this is viral in nature, have not pursued any additional stool studies at this time.  No recent antibiotic use or additional clinical factors to significantly increase index of suspicion for underlying C. difficile.  Plan: As needed IV Ativan for residual nausea.  Lactated Ringer's at 150 cc/h.  Check urinary drug screen.  Add on serum magnesium level.  Repeat CMP in the morning.  As needed IV Dilaudid for abdominal discomfort.  Monitor strict I's and O's.      #) elevated anion gap metabolic acidosis: Presenting CMP notable for bicarbonate of 17 with 9 elevated anion gap.  Suspect that this is on the basis of gastrointestinal loss lacks capacity of bicarb via recent episodes of diarrhea in the setting of suspected viral gastroenteritis, as further described above.   Doing plan: IV fluids, as above.  Additional evaluation management of presenting nausea/vomiting/diarrhea as above.  Repeat CMP in the morning.      #) Transaminitis: Mildly elevated transaminases and a noncholestatic pattern.  Suspect that this is a consequence of recent gastric sleeve surgery.  With CT abdomen/pelvis showing no evidence of acute intra-abdominal process.  Of note, criteria are not met for sepsis at this time.  The patient denies any history of alcohol consumption.  We will check acute viral hepatitis panel and further trend transaminases with repeat CMP in the morning.   Plan: Repeat CMP in the morning.   Acute viral hepatitis panel.  Check urinary drug screen.  Repeat CBC in the morning as well.     #) Generalized anxiety disorder: Outpatient regimen appears to consist of the scheduled Wellbutrin as well as Effexor, in addition to as needed Xanax.  Would likely benefit from clarification if he is truly prescribed concomitant Wellbutrin and Effexor given concern for serotonin syndrome.  Consequently, I have held both of these medications for now, with as needed anxiety lytic offered via current as needed IV Ativan.   Plan: Reconciliation of outpatient medications clarify the proposed concomitant use of Wellbutrin and Effexor as an outpatient.  As needed IV Ativan, as above.      #) GERD: On lansoprazole as an outpatient.  Given residual nausea/vomiting, will hold home PPI for now and order IV Protonix.  Plan: Hold home PPI.  IV Protonix, as above     #) Essential hypertension: Outpatient hypertensive regimen appears limited to lisinopril, which she has been unable to keep down over the last 2 days.  Systolic blood pressures in the ED has been noted to be in the 140s, without any evidence of hypotension.    Plan: In setting of residual nausea/vomiting, will continue to hold Seroquel for now.  Close monitoring of ensuing blood pressure via routine vital signs.    DVT  prophylaxis: sscd's  Code Status: Full code Family Communication: none Disposition Plan: Per Rounding Team Consults called: none  Admission status: Observation; med telemetry.    Of note, this patient was added by me to the following Admit List/Treatment Team:  mcadmits.     PLEASE NOTE THAT DRAGON DICTATION SOFTWARE WAS USED IN THE CONSTRUCTION OF THIS NOTE.   Rhetta Mura DO [facility] Pager [phone] From [place]   01/26/2021, 2:17 AM

## 2021-01-26 NOTE — ED Notes (Signed)
Blood draw unsuccessful-KS  

## 2021-01-26 NOTE — Progress Notes (Signed)
ST, low grade fever, no other symptoms. Pt has no complaint at this moment. Clear liquid tolerated. MD notified

## 2021-01-26 NOTE — Progress Notes (Signed)
Brief note regarding plan, with full H&P to follow:  20 year old male who recently underwent gastric sleeve surgery on January 04, [place] in [place], who is being admitted for intractable nausea/vomiting and associated dehydration after presenting with complaint of 2 days of nausea associated with nonbloody, nonbilious emesis as well as multiple episodes of watery loose stool over that time in the absence of melena or hematochezia.  Reports mild associated abdominal tenderness, without peritoneal signs on physical exam.  He notes that he recently had contacts with friends that subsequently were diagnosed with a "GI bug".  CT abdomen/pelvis showed no evidence of acute intra-abdominal process, including no evidence of bowel obstruction, perforation, or abscess.  Admitted for overnight observation for symptomatic management of the above, providing IV fluids.  As needed IV Ativan for nausea, as needed IV Dilaudid for pain.  Continuous lactated Ringer's running at 150 cc/h.  Patient currently n.p.o., although may advance diet as tolerated.      Newton Pigg, DO Hospitalist

## 2021-01-27 DIAGNOSIS — R1114 Bilious vomiting: Secondary | ICD-10-CM

## 2021-01-27 LAB — CBC
HCT: 34.6 % — ABNORMAL LOW (ref 39.0–52.0)
Hemoglobin: 10.9 g/dL — ABNORMAL LOW (ref 13.0–17.0)
MCH: 25.5 pg — ABNORMAL LOW (ref 26.0–34.0)
MCHC: 31.5 g/dL (ref 30.0–36.0)
MCV: 81 fL (ref 80.0–100.0)
Platelets: 210 10*3/uL (ref 150–400)
RBC: 4.27 MIL/uL (ref 4.22–5.81)
RDW: 16.4 % — ABNORMAL HIGH (ref 11.5–15.5)
WBC: 4.2 10*3/uL (ref 4.0–10.5)
nRBC: 0 % (ref 0.0–0.2)

## 2021-01-27 LAB — COMPREHENSIVE METABOLIC PANEL
ALT: 119 U/L — ABNORMAL HIGH (ref 0–44)
AST: 49 U/L — ABNORMAL HIGH (ref 15–41)
Albumin: 3 g/dL — ABNORMAL LOW (ref 3.5–5.0)
Alkaline Phosphatase: 71 U/L (ref 38–126)
Anion gap: 3 — ABNORMAL LOW (ref 5–15)
BUN: 5 mg/dL — ABNORMAL LOW (ref 6–20)
CO2: 25 mmol/L (ref 22–32)
Calcium: 8.7 mg/dL — ABNORMAL LOW (ref 8.9–10.3)
Chloride: 110 mmol/L (ref 98–111)
Creatinine, Ser: 0.75 mg/dL (ref 0.61–1.24)
GFR, Estimated: >60 mL/min (ref >60)
Glucose, Bld: 85 mg/dL (ref 70–99)
Potassium: 3.1 mmol/L — ABNORMAL LOW (ref 3.5–5.1)
Sodium: 138 mmol/L (ref 135–145)
Total Bilirubin: 0.4 mg/dL (ref 0.3–1.2)
Total Protein: 6.4 g/dL — ABNORMAL LOW (ref 6.5–8.1)

## 2021-01-27 LAB — MAGNESIUM: Magnesium: 2 mg/dL (ref 1.7–2.4)

## 2021-01-27 MED ORDER — POTASSIUM CHLORIDE CRYS ER 20 MEQ PO TBCR: 40.0000 meq | EXTENDED_RELEASE_TABLET | Freq: Every day | ORAL | 0 refills | Status: AC

## 2021-01-27 MED ORDER — POTASSIUM CHLORIDE CRYS ER 20 MEQ PO TBCR
40.0000 meq | EXTENDED_RELEASE_TABLET | Freq: Once | ORAL | Status: AC
  Administered 2021-01-27: 40 meq via ORAL
  Filled 2021-01-27: qty 2

## 2021-01-27 MED ORDER — ONDANSETRON 4 MG PO TBDP: 4.0000 mg | ORAL_TABLET | Freq: Three times a day (TID) | ORAL | 0 refills | Status: AC | PRN

## 2021-01-27 NOTE — Progress Notes (Signed)
Vincent Roberts to be D/C'd  per MD order. Discussed with the patient and all questions fully answered.  VSS, Skin clean, dry and intact without evidence of skin break down, no evidence of skin tears noted.  IV catheter discontinued intact. Site without signs and symptoms of complications. Dressing and pressure applied.  An After Visit Summary was printed and given to the patient. Patient received prescription.  D/c education completed with patient/family including follow up instructions, medication list, d/c activities limitations if indicated, with other d/c instructions as indicated by MD - patient able to verbalize understanding, all questions fully answered.   Patient instructed to return to ED, call 911, or call MD for any changes in condition.   Patient to be escorted via WC, and D/C home via private auto.

## 2021-01-27 NOTE — Plan of Care (Signed)

## 2021-01-27 NOTE — Plan of Care (Signed)
  Problem: Education: Goal: Knowledge of General Education information will improve Description: Including pain rating scale, medication(s)/side effects and non-pharmacologic comfort measures 01/27/2021 1119 by Quintella Baton, RN Outcome: Adequate for Discharge 01/27/2021 1118 by Quintella Baton, RN Outcome: Progressing   Problem: Health Behavior/Discharge Planning: Goal: Ability to manage health-related needs will improve 01/27/2021 1119 by Quintella Baton, RN Outcome: Adequate for Discharge 01/27/2021 1118 by Garlon Hatchet A, RN Outcome: Progressing   Problem: Clinical Measurements: Goal: Ability to maintain clinical measurements within normal limits will improve 01/27/2021 1119 by Quintella Baton, RN Outcome: Adequate for Discharge 01/27/2021 1118 by Garlon Hatchet A, RN Outcome: Progressing Goal: Will remain free from infection 01/27/2021 1119 by Quintella Baton, RN Outcome: Adequate for Discharge 01/27/2021 1118 by Quintella Baton, RN Outcome: Progressing Goal: Diagnostic test results will improve 01/27/2021 1119 by Quintella Baton, RN Outcome: Adequate for Discharge 01/27/2021 1118 by Garlon Hatchet A, RN Outcome: Progressing Goal: Respiratory complications will improve 01/27/2021 1119 by Quintella Baton, RN Outcome: Adequate for Discharge 01/27/2021 1118 by Garlon Hatchet A, RN Outcome: Progressing Goal: Cardiovascular complication will be avoided 01/27/2021 1119 by Quintella Baton, RN Outcome: Adequate for Discharge 01/27/2021 1118 by Quintella Baton, RN Outcome: Progressing   Problem: Activity: Goal: Risk for activity intolerance will decrease 01/27/2021 1119 by Quintella Baton, RN Outcome: Adequate for Discharge 01/27/2021 1118 by Garlon Hatchet A, RN Outcome: Progressing   Problem: Nutrition: Goal: Adequate nutrition will be maintained 01/27/2021 1119 by Quintella Baton, RN Outcome: Adequate for Discharge 01/27/2021 1118 by Quintella Baton, RN Outcome: Progressing    Problem: Coping: Goal: Level of anxiety will decrease 01/27/2021 1119 by Quintella Baton, RN Outcome: Adequate for Discharge 01/27/2021 1118 by Garlon Hatchet A, RN Outcome: Progressing   Problem: Elimination: Goal: Will not experience complications related to bowel motility 01/27/2021 1119 by Quintella Baton, RN Outcome: Adequate for Discharge 01/27/2021 1118 by Quintella Baton, RN Outcome: Progressing Goal: Will not experience complications related to urinary retention 01/27/2021 1119 by Quintella Baton, RN Outcome: Adequate for Discharge 01/27/2021 1118 by Quintella Baton, RN Outcome: Progressing   Problem: Pain Managment: Goal: General experience of comfort will improve 01/27/2021 1119 by Quintella Baton, RN Outcome: Adequate for Discharge 01/27/2021 1118 by Garlon Hatchet A, RN Outcome: Progressing   Problem: Safety: Goal: Ability to remain free from injury will improve 01/27/2021 1119 by Quintella Baton, RN Outcome: Adequate for Discharge 01/27/2021 1118 by Garlon Hatchet A, RN Outcome: Progressing   Problem: Skin Integrity: Goal: Risk for impaired skin integrity will decrease 01/27/2021 1119 by Quintella Baton, RN Outcome: Adequate for Discharge 01/27/2021 1118 by Quintella Baton, RN Outcome: Progressing

## 2021-01-27 NOTE — Discharge Summary (Addendum)
Physician Discharge Summary  Gregery Na LOV:[id number] DOB: 08/18/2001 DOA: 01/25/2021  PCP: System, Provider Not In  Admit date: 01/25/2021 Discharge date: 01/27/2021  Admitted From: Home  Disposition: Home   Recommendations for Outpatient Follow-up:  1. Follow up with PCP in 1-2 weeks 2. Please obtain BMP/CBC in one week 3. Needs to follow up with PCP and primary surgeon.  4. Needs Repeat LFT    Discharge Condition: Stable.  CODE STATUS: Full code Diet recommendation: Clear diet  Brief/Interim Summary: 20 year old past medical history significant for recent gastric sleeve procedure, generalized anxiety disorder, hypertension, GERD admitted to [facility] on 01/25/2021 with intractable nausea vomiting and diarrhea.  Reports sick contact. CT abdomen and pelvis no evidence of obstruction perforation or abscess recent gastric sleeve procedure was noted without overt abnormality.   1-Acute gastroenteritis: Presented with nausea vomiting and diarrhea. He report that  family members have similar symptoms with diarrhea. He received IV fluids, supportive care, as needed Zofran. He has been able to tolerate clear diet, no further nausea or vomiting.  Diarrhea resolved since he is receiving Imodium. Feels comfortable going home today. I advised him to follow-up with his primary surgeon.  2-Transaminases;  Trending down, hepatitis panel negative 3-hypertension: Resume home medication 4-Morbid  obesity: Continue with lifestyle modification 5-Hypokalemia; replete orally.     Discharge Diagnoses:  Principal Problem:   Nausea & vomiting Active Problems:   Diarrhea   Metabolic acidosis   HTN (hypertension)   GAD (generalized anxiety disorder)   Transaminitis   GERD (gastroesophageal reflux disease)   Acute gastroenteritis    Discharge Instructions  Discharge Instructions    Diet clear liquid   Complete by: As directed    Increase activity slowly   Complete by: As directed       Allergies as of 01/27/2021   No Known Allergies     Medication List    STOP taking these medications   enoxaparin 40 MG/0.4ML injection Commonly known as: LOVENOX   gabapentin 300 MG capsule Commonly known as: NEURONTIN   lansoprazole 30 MG capsule Commonly known as: PREVACID     TAKE these medications   ALPRAZolam 0.5 MG tablet Commonly known as: XANAX Take 0.5 mg by mouth 3 (three) times daily as needed for anxiety.   buPROPion 150 MG 24 hr tablet Commonly known as: WELLBUTRIN XL Take 150 mg by mouth at bedtime.   fluticasone 50 MCG/ACT nasal spray Commonly known as: FLONASE Place 1 spray into both nostrils daily as needed for allergies.   lisinopril 20 MG tablet Commonly known as: ZESTRIL Take 20 mg by mouth at bedtime.   multivitamin tablet Take 1 tablet by mouth daily.   ondansetron 4 MG disintegrating tablet Commonly known as: ZOFRAN-ODT Take 1 tablet (4 mg total) by mouth every 8 (eight) hours as needed for vomiting or nausea.   potassium chloride SA 20 MEQ tablet Commonly known as: KLOR-CON Take 2 tablets (40 mEq total) by mouth daily for 1 day.   ursodiol 300 MG capsule Commonly known as: ACTIGALL Take 300 mg by mouth 2 (two) times daily.   venlafaxine XR 150 MG 24 hr capsule Commonly known as: EFFEXOR-XR Take 150 mg by mouth at bedtime.       No Known Allergies  Consultations:  None   Procedures/Studies: DG Chest 2 View  Result Date: 01/25/2021 CLINICAL DATA:  Chest pain and vomiting. EXAM: CHEST - 2 VIEW COMPARISON:  None. FINDINGS: Decreased lung volumes are seen which is  likely secondary to the degree of patient inspiration. Very mild atelectasis and/or early infiltrate is seen within the left lung base. There is no evidence of a pleural effusion or pneumothorax. The heart size and mediastinal contours are within normal limits. The visualized skeletal structures are unremarkable. IMPRESSION: Very mild left basilar atelectasis and/or  early infiltrate. Electronically Signed   By: Aram Candela M.D.   On: 01/25/2021 21:16   CT Angio Chest PE W and/or Wo Contrast  Result Date: 01/26/2021 CLINICAL DATA:  Gastric sleeve procedure 3 weeks ago.  Vomiting. EXAM: CT ANGIOGRAPHY CHEST CT ABDOMEN AND PELVIS WITH CONTRAST TECHNIQUE: Multidetector CT imaging of the chest was performed using the standard protocol during bolus administration of intravenous contrast. Multiplanar CT image reconstructions and MIPs were obtained to evaluate the vascular anatomy. Multidetector CT imaging of the abdomen and pelvis was performed using the standard protocol during bolus administration of intravenous contrast. CONTRAST:  OMNIPAQUE IOHEXOL 350 MG/ML SOLN COMPARISON:  None. FINDINGS: CTA CHEST FINDINGS Cardiovascular: Contrast injection is sufficient to demonstrate satisfactory opacification of the pulmonary arteries to the segmental level. There is no pulmonary embolus. The main pulmonary artery is within normal limits for size. There is no CT evidence of acute right heart strain. The visualized aorta is normal. There is a normal 3-vessel arch branching pattern. Heart size is normal, without pericardial effusion. Mediastinum/Nodes: No mediastinal, hilar or axillary lymphadenopathy. The visualized thyroid and thoracic esophageal course are unremarkable. Lungs/Pleura: No pulmonary nodules or masses. No pleural effusion or pneumothorax. No focal airspace consolidation. No focal pleural abnormality. Musculoskeletal: No chest wall abnormality. No acute or significant osseous findings. Review of the MIP images confirms the above findings. CT ABDOMEN and PELVIS FINDINGS Hepatobiliary: Normal hepatic contours and density. No visible biliary dilatation. Normal gallbladder. Pancreas: Normal contours without ductal dilatation. No peripancreatic fluid collection. Spleen: Normal. Adrenals/Urinary Tract: --Adrenal glands: Normal. --Right kidney/ureter: No hydronephrosis  or perinephric stranding. No nephrolithiasis. No obstructing ureteral stones. --Left kidney/ureter: No hydronephrosis or perinephric stranding. No nephrolithiasis. No obstructing ureteral stones. --Urinary bladder: Unremarkable. Stomach/Bowel: --Stomach/Duodenum: Recent gastric sleeve procedure. No abnormality. --Small bowel: No dilatation or inflammation. --Colon: No focal abnormality. --Appendix: Normal. Vascular/Lymphatic: Normal course and caliber of the major abdominal vessels. No abdominal or pelvic lymphadenopathy. Reproductive: Normal prostate and seminal vesicles. Musculoskeletal. No bony spinal canal stenosis or focal osseous abnormality. Other: None. IMPRESSION: 1. No pulmonary embolus or acute aortic syndrome. 2. Recent gastric sleeve procedure without abnormality. 3. No acute abnormality of the chest, abdomen or pelvis. Electronically Signed   By: Deatra Robinson M.D.   On: 01/26/2021 00:46   CT ABDOMEN PELVIS W CONTRAST  Result Date: 01/26/2021 CLINICAL DATA:  Gastric sleeve procedure 3 weeks ago.  Vomiting. EXAM: CT ANGIOGRAPHY CHEST CT ABDOMEN AND PELVIS WITH CONTRAST TECHNIQUE: Multidetector CT imaging of the chest was performed using the standard protocol during bolus administration of intravenous contrast. Multiplanar CT image reconstructions and MIPs were obtained to evaluate the vascular anatomy. Multidetector CT imaging of the abdomen and pelvis was performed using the standard protocol during bolus administration of intravenous contrast. CONTRAST:  OMNIPAQUE IOHEXOL 350 MG/ML SOLN COMPARISON:  None. FINDINGS: CTA CHEST FINDINGS Cardiovascular: Contrast injection is sufficient to demonstrate satisfactory opacification of the pulmonary arteries to the segmental level. There is no pulmonary embolus. The main pulmonary artery is within normal limits for size. There is no CT evidence of acute right heart strain. The visualized aorta is normal. There is a normal 3-vessel arch branching  pattern. Heart size is normal, without pericardial effusion. Mediastinum/Nodes: No mediastinal, hilar or axillary lymphadenopathy. The visualized thyroid and thoracic esophageal course are unremarkable. Lungs/Pleura: No pulmonary nodules or masses. No pleural effusion or pneumothorax. No focal airspace consolidation. No focal pleural abnormality. Musculoskeletal: No chest wall abnormality. No acute or significant osseous findings. Review of the MIP images confirms the above findings. CT ABDOMEN and PELVIS FINDINGS Hepatobiliary: Normal hepatic contours and density. No visible biliary dilatation. Normal gallbladder. Pancreas: Normal contours without ductal dilatation. No peripancreatic fluid collection. Spleen: Normal. Adrenals/Urinary Tract: --Adrenal glands: Normal. --Right kidney/ureter: No hydronephrosis or perinephric stranding. No nephrolithiasis. No obstructing ureteral stones. --Left kidney/ureter: No hydronephrosis or perinephric stranding. No nephrolithiasis. No obstructing ureteral stones. --Urinary bladder: Unremarkable. Stomach/Bowel: --Stomach/Duodenum: Recent gastric sleeve procedure. No abnormality. --Small bowel: No dilatation or inflammation. --Colon: No focal abnormality. --Appendix: Normal. Vascular/Lymphatic: Normal course and caliber of the major abdominal vessels. No abdominal or pelvic lymphadenopathy. Reproductive: Normal prostate and seminal vesicles. Musculoskeletal. No bony spinal canal stenosis or focal osseous abnormality. Other: None. IMPRESSION: 1. No pulmonary embolus or acute aortic syndrome. 2. Recent gastric sleeve procedure without abnormality. 3. No acute abnormality of the chest, abdomen or pelvis. Electronically Signed   By: Deatra Robinson M.D.   On: 01/26/2021 00:46      Subjective: He is feeling improved, tolerating clear diet.  No diarrhea since last night   Discharge Exam: Vitals:   01/26/21 2058 01/27/21 0549  BP: 125/64 116/65  Pulse: 87 60  Resp: 18 19   Temp: 98.2 F (36.8 C) 97.9 F (36.6 C)  SpO2: 100% 97%     General: Pt is alert, awake, not in acute distress Cardiovascular: RRR, S1/S2 +, no rubs, no gallops Respiratory: CTA bilaterally, no wheezing, no rhonchi Abdominal: Soft, NT, ND, bowel sounds + Extremities: no edema, no cyanosis    The results of significant diagnostics from this hospitalization (including imaging, microbiology, ancillary and laboratory) are listed below for reference.     Microbiology: Recent Results (from the past 240 hour(s))  Resp Panel by RT-PCR (Flu A&B, Covid) Nasopharyngeal Swab     Status: None   Collection Time: 01/26/21  1:18 AM   Specimen: Nasopharyngeal Swab; Nasopharyngeal(NP) swabs in vial transport medium  Result Value Ref Range Status   SARS Coronavirus 2 by RT PCR NEGATIVE NEGATIVE Final    Comment: (NOTE) SARS-CoV-2 target nucleic acids are NOT DETECTED.  The SARS-CoV-2 RNA is generally detectable in upper respiratory specimens during the acute phase of infection. The lowest concentration of SARS-CoV-2 viral copies this assay can detect is 138 copies/mL. A negative result does not preclude SARS-Cov-2 infection and should not be used as the sole basis for treatment or other patient management decisions. A negative result may occur with  improper specimen collection/handling, submission of specimen other than nasopharyngeal swab, presence of viral mutation(s) within the areas targeted by this assay, and inadequate number of viral copies(<138 copies/mL). A negative result must be combined with clinical observations, patient history, and epidemiological information. The expected result is Negative.  Fact Sheet for Patients:  BloggerCourse.com  Fact Sheet for Healthcare Providers:  SeriousBroker.it  This test is no t yet approved or cleared by the [place] FDA and  has been authorized for detection and/or diagnosis of  SARS-CoV-2 by FDA under an Emergency Use Authorization (EUA). This EUA will remain  in effect (meaning this test can be used) for the duration of the COVID-19 declaration under Section 564(b)(1) of the Act, 21  U.S.C.section 360bbb-3(b)(1), unless the authorization is terminated  or revoked sooner.       Influenza A by PCR NEGATIVE NEGATIVE Final   Influenza B by PCR NEGATIVE NEGATIVE Final    Comment: (NOTE) The Xpert Xpress SARS-CoV-2/FLU/RSV plus assay is intended as an aid in the diagnosis of influenza from Nasopharyngeal swab specimens and should not be used as a sole basis for treatment. Nasal washings and aspirates are unacceptable for Xpert Xpress SARS-CoV-2/FLU/RSV testing.  Fact Sheet for Patients: BloggerCourse.comhttps://www.fda.gov/media/152166/download  Fact Sheet for Healthcare Providers: SeriousBroker.ithttps://www.fda.gov/media/152162/download  This test is not yet approved or cleared by the [place] FDA and has been authorized for detection and/or diagnosis of SARS-CoV-2 by FDA under an Emergency Use Authorization (EUA). This EUA will remain in effect (meaning this test can be used) for the duration of the COVID-19 declaration under Section 564(b)(1) of the Act, 21 U.S.C. section 360bbb-3(b)(1), unless the authorization is terminated or revoked.  Performed at [facility], [address] [place], [place] [address]      Labs: BNP (last 3 results) No results for input(s): BNP in the last 8760 hours. Basic Metabolic Panel: Recent Labs  Lab 01/25/21 1813 01/25/21 2113 01/26/21 0439 01/27/21 0255  NA 133* 137 137 138  K 3.9 3.9 3.9 3.1*  CL 105 109 106 110  CO2 17* 18* 23 25  GLUCOSE 129* 124* 109* 85  BUN 11 8 7  5*  CREATININE 0.65 0.80 0.83 0.75  CALCIUM 9.6 9.7 9.2 8.7*  MG  --   --  1.5* 2.0   Liver Function Tests: Recent Labs  Lab 01/25/21 1813 01/25/21 2113 01/26/21 0439 01/27/21 0255  AST 65* 66* 61* 49*  ALT 173* 179* 145* 119*  ALKPHOS 90 92 79 71   BILITOT 0.8 1.0 0.9 0.4  PROT 8.7* 8.3* 7.1 6.4*  ALBUMIN 4.1 4.0 3.2* 3.0*   Recent Labs  Lab 01/25/21 1813 01/25/21 2113  LIPASE 27 25   No results for input(s): AMMONIA in the last 168 hours. CBC: Recent Labs  Lab 01/25/21 1813 01/25/21 2113 01/26/21 0439 01/27/21 0255  WBC 8.9 8.0 7.2 4.2  NEUTROABS  --  7.3  --   --   HGB 14.2 14.2 12.5* 10.9*  HCT 43.8 45.0 39.7 34.6*  MCV 79.6* 80.9 82.4 81.0  PLT 278 286 243 210   Cardiac Enzymes: No results for input(s): CKTOTAL, CKMB, CKMBINDEX, TROPONINI in the last 168 hours. BNP: Invalid input(s): POCBNP CBG: No results for input(s): GLUCAP in the last 168 hours. D-Dimer Recent Labs    01/25/21 2113  DDIMER 1.58*   Hgb A1c No results for input(s): HGBA1C in the last 72 hours. Lipid Profile No results for input(s): CHOL, HDL, LDLCALC, TRIG, CHOLHDL, LDLDIRECT in the last 72 hours. Thyroid function studies Recent Labs    01/26/21 0239  TSH 1.135   Anemia work up No results for input(s): VITAMINB12, FOLATE, FERRITIN, TIBC, IRON, RETICCTPCT in the last 72 hours. Urinalysis    Component Value Date/Time   COLORURINE YELLOW 01/26/2021 0237   APPEARANCEUR CLEAR 01/26/2021 0237   LABSPEC >1.046 (H) 01/26/2021 0237   PHURINE 5.0 01/26/2021 0237   GLUCOSEU NEGATIVE 01/26/2021 0237   HGBUR NEGATIVE 01/26/2021 0237   BILIRUBINUR NEGATIVE 01/26/2021 0237   KETONESUR 20 (A) 01/26/2021 0237   PROTEINUR 30 (A) 01/26/2021 0237   NITRITE NEGATIVE 01/26/2021 0237   LEUKOCYTESUR NEGATIVE 01/26/2021 0237   Sepsis Labs Invalid input(s): PROCALCITONIN,  WBC,  LACTICIDVEN Microbiology Recent Results (  from the past 240 hour(s))  Resp Panel by RT-PCR (Flu A&B, Covid) Nasopharyngeal Swab     Status: None   Collection Time: 01/26/21  1:18 AM   Specimen: Nasopharyngeal Swab; Nasopharyngeal(NP) swabs in vial transport medium  Result Value Ref Range Status   SARS Coronavirus 2 by RT PCR NEGATIVE NEGATIVE Final    Comment:  (NOTE) SARS-CoV-2 target nucleic acids are NOT DETECTED.  The SARS-CoV-2 RNA is generally detectable in upper respiratory specimens during the acute phase of infection. The lowest concentration of SARS-CoV-2 viral copies this assay can detect is 138 copies/mL. A negative result does not preclude SARS-Cov-2 infection and should not be used as the sole basis for treatment or other patient management decisions. A negative result may occur with  improper specimen collection/handling, submission of specimen other than nasopharyngeal swab, presence of viral mutation(s) within the areas targeted by this assay, and inadequate number of viral copies(<138 copies/mL). A negative result must be combined with clinical observations, patient history, and epidemiological information. The expected result is Negative.  Fact Sheet for Patients:  BloggerCourse.com  Fact Sheet for Healthcare Providers:  SeriousBroker.it  This test is no t yet approved or cleared by the [place] FDA and  has been authorized for detection and/or diagnosis of SARS-CoV-2 by FDA under an Emergency Use Authorization (EUA). This EUA will remain  in effect (meaning this test can be used) for the duration of the COVID-19 declaration under Section 564(b)(1) of the Act, 21 U.S.C.section 360bbb-3(b)(1), unless the authorization is terminated  or revoked sooner.       Influenza A by PCR NEGATIVE NEGATIVE Final   Influenza B by PCR NEGATIVE NEGATIVE Final    Comment: (NOTE) The Xpert Xpress SARS-CoV-2/FLU/RSV plus assay is intended as an aid in the diagnosis of influenza from Nasopharyngeal swab specimens and should not be used as a sole basis for treatment. Nasal washings and aspirates are unacceptable for Xpert Xpress SARS-CoV-2/FLU/RSV testing.  Fact Sheet for Patients: BloggerCourse.com  Fact Sheet for Healthcare  Providers: SeriousBroker.it  This test is not yet approved or cleared by the [place] FDA and has been authorized for detection and/or diagnosis of SARS-CoV-2 by FDA under an Emergency Use Authorization (EUA). This EUA will remain in effect (meaning this test can be used) for the duration of the COVID-19 declaration under Section 564(b)(1) of the Act, 21 U.S.C. section 360bbb-3(b)(1), unless the authorization is terminated or revoked.  Performed at [facility], [address] [place], [place] [address]      Time coordinating discharge: 40 minutes  SIGNED:   Alba Cory, MD  [facility]

## 2021-02-05 ENCOUNTER — Encounter (HOSPITAL_BASED_OUTPATIENT_CLINIC_OR_DEPARTMENT_OTHER): Payer: Self-pay | Admitting: Surgery

## 2021-03-02 ENCOUNTER — Ambulatory Visit (HOSPITAL_BASED_OUTPATIENT_CLINIC_OR_DEPARTMENT_OTHER): Payer: Self-pay | Admitting: Registered"

## 2021-03-02 ENCOUNTER — Other Ambulatory Visit (INDEPENDENT_AMBULATORY_CARE_PROVIDER_SITE_OTHER): Payer: Self-pay | Admitting: Family

## 2021-03-02 DIAGNOSIS — K219 Gastro-esophageal reflux disease without esophagitis: Secondary | ICD-10-CM

## 2021-03-03 ENCOUNTER — Other Ambulatory Visit (INDEPENDENT_AMBULATORY_CARE_PROVIDER_SITE_OTHER): Payer: Self-pay | Admitting: Family

## 2021-03-03 DIAGNOSIS — K219 Gastro-esophageal reflux disease without esophagitis: Secondary | ICD-10-CM

## 2021-03-08 ENCOUNTER — Ambulatory Visit (INDEPENDENT_AMBULATORY_CARE_PROVIDER_SITE_OTHER): Payer: Commercial Managed Care - POS | Admitting: Family Medicine

## 2021-03-08 ENCOUNTER — Telehealth (INDEPENDENT_AMBULATORY_CARE_PROVIDER_SITE_OTHER): Payer: Self-pay

## 2021-03-08 ENCOUNTER — Encounter (INDEPENDENT_AMBULATORY_CARE_PROVIDER_SITE_OTHER): Payer: Self-pay | Admitting: Family Medicine

## 2021-03-08 DIAGNOSIS — F411 Generalized anxiety disorder: Secondary | ICD-10-CM

## 2021-03-08 DIAGNOSIS — K219 Gastro-esophageal reflux disease without esophagitis: Secondary | ICD-10-CM

## 2021-03-08 DIAGNOSIS — F322 Major depressive disorder, single episode, severe without psychotic features: Secondary | ICD-10-CM

## 2021-03-08 MED ORDER — VENLAFAXINE HCL ER 75 MG PO CP24
150.0000 mg | ORAL_CAPSULE | Freq: Every day | ORAL | 0 refills | Status: DC
Start: 2021-03-08 — End: 2021-04-07

## 2021-03-08 MED ORDER — LANSOPRAZOLE 30 MG PO CPDR
30.0000 mg | DELAYED_RELEASE_CAPSULE | Freq: Every day | ORAL | 0 refills | Status: DC
Start: 2021-03-08 — End: 2021-04-07

## 2021-03-08 NOTE — Telephone Encounter (Signed)
Rx sent to pharmacy   

## 2021-03-08 NOTE — Progress Notes (Signed)
[facility] - [facility] [place] [facility]                       Date of Exam: 03/08/2021 2:19 PM        Patient ID: Jason Ponce is a 20 y.o. male.  Attending Physician: Arvilla Meres, MD        Chief Complaint:    Chief Complaint   Patient presents with   . F/up on Anxiety Medication      Venlafaxine Not working - pt reports side effect "feels no emotions" , Stomach issue                HPI:    20yo M with morbid obesity s/p lap gastric sleeve, HTN, anxiety, depression, and ADHD here to discuss changing his anxiety medication.  Pt's current regimen includes effexor 150 and bupropion 150.  Pt states he's had increasing nausea, sweating, and emotional blunting.  Of note, he did have GI side effects from lexapro previously and would like to avoid another SSRI.  He also thinks that any medication with BID dosing will also be problematic.            Problem List:    Patient Active Problem List   Diagnosis   . Attention deficit hyperactivity disorder, combined type   . Generalized anxiety disorder   . Migraine   . Moderately severe major depression   . Morbid obesity   . Adult BMI 50.0-59.9 kg/sq m   . Gastroesophageal reflux disease without esophagitis   . Benign essential hypertension   . H/O gastric sleeve             Current Meds:    Outpatient Medications Marked as Taking for the 03/08/21 encounter (Office Visit) with Arvilla Meres, MD   Medication Sig Dispense Refill   . ALPRAZolam (XANAX) 0.5 MG tablet Take 1 tablet (0.5 mg total) by mouth 3 (three) times daily as needed for Sleep or Anxiety 30 tablet 1   . Ascorbic Acid (VITAMIN C PO) Take by mouth daily     . B Complex-Biotin-FA (VITAMIN B50 COMPLEX PO) Take by mouth daily     . buPROPion XL (WELLBUTRIN XL) 150 MG 24 hr tablet Take 1 tablet (150 mg total) by mouth daily (Patient taking differently: Take 150 mg by mouth nightly) 90 tablet 1   . Ferrous Sulfate (IRON PO) Take by mouth daily     . lansoprazole (PREVACID) 30 MG capsule TAKE  1 CAPSULE DAILY 90 capsule 3   . lisinopril (ZESTRIL) 20 MG tablet Take 1 tablet (20 mg total) by mouth daily (Patient taking differently: Take 20 mg by mouth nightly) 90 tablet 1   . [DISCONTINUED] venlafaxine (Effexor XR) 150 MG 24 hr capsule Take 1 capsule (150 mg total) by mouth daily (Patient taking differently: Take 150 mg by mouth nightly) 90 capsule 1          Allergies:    No Known Allergies          Past Surgical History:    Past Surgical History:   Procedure Laterality Date   . EGD, BIOPSY N/A 09/16/2020    Procedure: EGD, BIOPSY;  Surgeon: Tommye Standard, MD;  Location: [facility];  Service: General;  Laterality: N/A;   . LAPAROSCOPIC, GASTRECTOMY SLEEVE N/A 01/04/2021    Procedure: LAPAROSCOPIC, GASTRECTOMY SLEEVE and Omentopexy;  Surgeon: Delorse Lek, MD;  Location: [facility];  Service: General;  Laterality: N/A;           Family History:    Family History   Problem Relation Age of Onset   . Obesity Other    . Diabetes Other    . Esophageal cancer Neg Hx    . Inflammatory bowel disease Neg Hx    . Stomach cancer Neg Hx            Social History:    Social History     Tobacco Use   . Smoking status: Never Smoker   . Smokeless tobacco: Never Used   Vaping Use   . Vaping Use: Never used   Substance Use Topics   . Alcohol use: Never   . Drug use: Not Currently     Types: Benzodiazepines     Comment: H/O Vyvanse use - currently takes Alprazolam            The following sections were reviewed this encounter by the provider:              Vital Signs:    BP 138/81 (BP Site: Right arm, Patient Position: Sitting, Cuff Size: Large)   Pulse 91   Temp 98.2 F (36.8 C) (Tympanic)   Ht 1.778 m (5\' 10" )   Wt (!) 147.3 kg (324 lb 12.8 oz)   BMI 46.60 kg/m          ROS:    Review of Systems   Constitutional: Negative for chills and fever.   Respiratory: Negative for shortness of breath.    Cardiovascular: Negative for chest pain and palpitations.   Gastrointestinal: Negative for abdominal pain,  diarrhea and vomiting.              Physical Exam:    Physical Exam  Constitutional:       General: He is not in acute distress.     Appearance: He is obese. He is not ill-appearing.   HENT:      Head: Normocephalic and atraumatic.   Eyes:      Extraocular Movements: Extraocular movements intact.      Conjunctiva/sclera: Conjunctivae normal.      Pupils: Pupils are equal, round, and reactive to light.   Cardiovascular:      Rate and Rhythm: Normal rate and regular rhythm.   Pulmonary:      Effort: Pulmonary effort is normal.      Breath sounds: Normal breath sounds.   Abdominal:      General: Bowel sounds are normal. There is no distension.      Palpations: Abdomen is soft.      Tenderness: There is no abdominal tenderness.   Musculoskeletal:      Right lower leg: No edema.      Left lower leg: No edema.   Neurological:      Mental Status: He is alert.              Assessment:    1. Generalized anxiety disorder  - venlafaxine (Effexor XR) 75 MG 24 hr capsule; Take 2 capsules (150 mg total) by mouth daily  Dispense: 30 capsule; Refill: 0    2. Moderately severe major depression  - venlafaxine (Effexor XR) 75 MG 24 hr capsule; Take 2 capsules (150 mg total) by mouth daily  Dispense: 30 capsule; Refill: 0            Plan:    Suspect side effects are secondary to relative dose increase from post sleeve  weight loss  Discussed various treatment options and pt is amenable to dose reduction of venlafaxine          Follow-up:    Return in about 4 weeks (around 04/05/2021) for Med check.         Lowry Bala Sloan Leiter, MD

## 2021-03-08 NOTE — Telephone Encounter (Signed)
SWP, relayed your message.

## 2021-03-08 NOTE — Telephone Encounter (Signed)
Please notify patient Lansoprazole has been sent in to pharmacy. Thank you

## 2021-03-08 NOTE — Addendum Note (Signed)
Addended by: Lorenza Evangelist SOO on: 03/08/2021 04:27 PM     Modules accepted: Orders

## 2021-03-08 NOTE — Telephone Encounter (Signed)
At 2:21pm, patient left a message requesting to get a prescription for Lansoprazole. The doctor who initially prescribe it for him he no longer sees.

## 2021-03-12 ENCOUNTER — Telehealth (INDEPENDENT_AMBULATORY_CARE_PROVIDER_SITE_OTHER): Payer: Commercial Managed Care - POS | Admitting: Registered"

## 2021-03-12 ENCOUNTER — Telehealth (INDEPENDENT_AMBULATORY_CARE_PROVIDER_SITE_OTHER): Payer: Commercial Managed Care - POS | Admitting: Surgery

## 2021-03-12 ENCOUNTER — Encounter (HOSPITAL_BASED_OUTPATIENT_CLINIC_OR_DEPARTMENT_OTHER): Payer: Self-pay | Admitting: Registered"

## 2021-03-12 ENCOUNTER — Encounter (HOSPITAL_BASED_OUTPATIENT_CLINIC_OR_DEPARTMENT_OTHER): Payer: Self-pay | Admitting: Surgery

## 2021-03-12 VITALS — Ht 71.0 in | Wt 324.0 lb

## 2021-03-12 DIAGNOSIS — Z9884 Bariatric surgery status: Secondary | ICD-10-CM

## 2021-03-12 DIAGNOSIS — Z713 Dietary counseling and surveillance: Secondary | ICD-10-CM

## 2021-03-12 DIAGNOSIS — F902 Attention-deficit hyperactivity disorder, combined type: Secondary | ICD-10-CM

## 2021-03-12 DIAGNOSIS — Z903 Acquired absence of stomach [part of]: Secondary | ICD-10-CM

## 2021-03-12 DIAGNOSIS — Z6841 Body Mass Index (BMI) 40.0 and over, adult: Secondary | ICD-10-CM | POA: Insufficient documentation

## 2021-03-12 NOTE — Progress Notes (Signed)
Progress Note    Patient Name: Jason Ponce, Jason Ponce  Age: 20 y.o.  Sex: male   DOB: Aug 02, 2001  MRN: [id number]    Subjective:   Jason Ponce returns for follow up 2 months post Laparoscopic Sleeve Gastrectomy . He is doing well with 52 lbs total weight loss since surgery. He is tolerating a regular diet and denies any nausea, vomiting, reflux, or abdominal pain. He is compliant with portion control and vitamin supplementation. He is drinking 64 oz of fluids and getting 70 grams of protein from supplements daily. His energy level is good. He is exercising regularly 3 times per week. Personal trainer    [place] with dehydration due to emesis and diarrhea. He stayed for 1-2 days and was never diagnosed with a specific condition. He felt better after hydration. He has not had issues since then.    Has not yet obtained his 3 month post operative labs to review    Past Medical History:     Past Medical History:   Diagnosis Date    Anxiety     Attention deficit hyperactivity disorder (ADHD)     Claustrophobia     COVID-19 vaccine series completed     completed 2 doses Pfizer vaccine    Depression     Dyspnea on exertion 2014    Gastritis     EGD 2021    Gastroesophageal reflux disease     occasional    Hypertension 2019    stable on meds    Migraine 2010    Morbid obesity     BMI 50.6        Past Surgical History:     Past Surgical History:   Procedure Laterality Date    EGD, BIOPSY N/A 09/16/2020    Procedure: EGD, BIOPSY;  Surgeon: Tommye Standard, MD;  Location: [facility];  Service: General;  Laterality: N/A;    LAPAROSCOPIC, GASTRECTOMY SLEEVE N/A 01/04/2021    Procedure: LAPAROSCOPIC, GASTRECTOMY SLEEVE and Omentopexy;  Surgeon: Delorse Lek, MD;  Location: [facility];  Service: General;  Laterality: N/A;       Family History:     Family History   Problem Relation Age of Onset    Obesity Other     Diabetes Other     Esophageal cancer Neg Hx     Inflammatory  bowel disease Neg Hx     Stomach cancer Neg Hx        Allergies:   No Known Allergies    Medications:     Prior to Admission medications    Medication Sig Start Date End Date Taking? Authorizing Provider   ALPRAZolam Prudy Feeler) 0.5 MG tablet Take 1 tablet (0.5 mg total) by mouth 3 (three) times daily as needed for Sleep or Anxiety 12/18/20  Yes Arvilla Meres, MD   Ascorbic Acid (VITAMIN C PO) Take by mouth daily   Yes [provider]   B Complex-Biotin-FA (VITAMIN B50 COMPLEX PO) Take by mouth daily   Yes [provider]   buPROPion XL (WELLBUTRIN XL) 150 MG 24 hr tablet Take 1 tablet (150 mg total) by mouth daily  Patient taking differently: Take 150 mg by mouth nightly 11/20/20  Yes Yi, Dustin Folks, MD   Ferrous Sulfate (IRON PO) Take by mouth daily   Yes [provider]   lansoprazole (PREVACID) 30 MG capsule Take 1 capsule (30 mg total) by mouth daily 03/08/21  Yes Arvilla Meres, MD   lisinopril (ZESTRIL) 20 MG tablet Take 1 tablet (20 mg total) by mouth daily  Patient taking differently: Take 20 mg by mouth nightly 12/18/20 06/16/21 Yes Arvilla Meres, MD   Multiple Vitamin (multivitamin) tablet Take 1 tablet by mouth daily   Yes [provider]   venlafaxine (Effexor XR) 75 MG 24 hr capsule Take 2 capsules (150 mg total) by mouth daily 03/08/21  Yes Arvilla Meres, MD       Review of Systems:     Constitutional: negative for fevers, night sweats  Head and neck: negative for visual changes, eye pain, dry mouth, or hearing impairment  Respiratory: negative for SOB, cough  Cardiovascular: negative for chest pain, palpitations  Gastrointestinal: negative for abdominal pain or bloating  Genitourinary: negative for hematuria, dysuria  Musculoskeletal: negative for bone pain, myalgias and stiff joints  Skin: no rash, no itching, no wounds  Neurological: negative for dizziness, gait problems, headaches, or memory problems  Behavioral/Psych: negative for fatigue, loss of interest in favorite  activities, sleep disturbance  Endocrine: negative for temperature intolerance      Physical Exam:   Ht 5\' 11"    Wt (!) 324 lb   BMI 45.19 kg/m   BMI: Body mass index is 45.19 kg/m.  Previous Weight:   Wt Readings from Last 15 Encounters:   03/12/21 (!) 324 lb (>99 %, Z= 3.27)*   03/12/21 (!) 324 lb 14.4 oz (>99 %, Z= 3.28)*   03/08/21 (!) 324 lb 12.8 oz (>99 %, Z= 3.28)*   01/19/21 (!) 353 lb 11.2 oz (>99 %, Z= 3.50)*   01/19/21 (!) 349 lb (>99 %, Z= 3.46)*   01/04/21 (!) 368 lb 1.6 oz (>99 %, Z= 3.60)*   12/30/20 (!) 373 lb (>99 %, Z= 3.63)*   12/28/20 (!) 373 lb (>99 %, Z= 3.63)*   12/18/20 (!) 371 lb (>99 %, Z= 3.61)*   11/23/20 (!) 371 lb (>99 %, Z= 3.61)*   11/20/20 (!) 371 lb 14.4 oz (>99 %, Z= 3.61)*   10/30/20 (!) 379 lb (>99 %, Z= 3.66)*   10/23/20 (!) 378 lb 12.8 oz (>99 %, Z= 3.65)*   10/02/20 (!) 376 lb (>99 %, Z= 3.64)*   09/30/20 (!) 382 lb (>99 %, Z= 3.67)*     * Growth percentiles are based on CDC (Boys, 2-20 Years) data.        Appearance: comfortable, no acute distress, well nourished  HEENT: normocephalic, clear conjunctiva   Chest: clear bilaterally, regular respiratory rate, breathing unlabored; no wheeze, rhonchi, or rales  Cardiac: S1, S2, regular rate and rhythm; no murmur rub or gallop  Abdomen: soft, non-tender, no masses, non-distended, positive bowel sounds all four quadrants  Musculoskeletal: grossly intact ROM and motor strength, no obvious deformities  Extremities: no edema, no calf pain, no ecchymosis, no cyanosis, no clubbing  Skin: no rash or ecchymosis   Neuro: alert and oriented x 3, able to move all extremities, gait steady  Psych: calm and cooperative       Labs Reviewed:     Lab Results   Component Value Date    WBC 12.43 (H) 01/05/2021    HGB 12.9 01/05/2021    HCT 41.4 01/05/2021    MCV 79.6 01/05/2021    PLT 300 01/05/2021     '  Chemistry        Component Value Date/Time    NA 139 01/05/2021 0451    K  4.5 01/05/2021 0451    CL 107 01/05/2021 0451    CO2 19 (L)  01/05/2021 0451    BUN 3 (L) 01/05/2021 0451    CREAT 0.6 (L) 01/05/2021 0451    CREAT 0.74 (L) 05/08/2020 1411    GLU 84 01/05/2021 0451        Component Value Date/Time    CA 9.1 01/05/2021 0451    ALKPHOS 134 11/23/2020 0933    AST 29 11/23/2020 0933    ALT 59 (H) 11/23/2020 0933    BILITOTAL 0.2 11/23/2020 0933          Lab Results   Component Value Date    ALT 59 (H) 11/23/2020    AST 29 11/23/2020    ALKPHOS 134 11/23/2020    BILITOTAL 0.2 11/23/2020     Lab Results   Component Value Date    VITD 18 (L) 11/23/2020     Lab Results   Component Value Date    B12 682 11/23/2020     No results found for: VITAMINB1  No results found for: VITAMINARETI  Cholesterol   Date Value Ref Range Status   11/23/2020 161 0 - 199 mg/dL Final   16/06/9603 540 100 - 169 mg/dL Final     Triglycerides   Date Value Ref Range Status   11/23/2020 103 34 - 149 mg/dL Final   98/07/9146 66 0 - 89 mg/dL Final     HDL   Date Value Ref Range Status   11/23/2020 54 40 - 9,999 mg/dL Final     Comment:     An HDL cholesterol <40 mg/dL is low and constitutes a  coronary heart disease risk factor, and HDL-C>59 mg/dL is  a negative risk factor for CHD.  Ref: American Heart Association; Circulation 2004     05/08/2020 54 >39 mg/dL Final     Cholesterol / HDL Ratio   Date Value Ref Range Status   11/23/2020 3.0 See Below Final     Comment:     Chol/HDL Ratio:  Classification                   Male     Male  Very Low (1/2 Average Risk)      <3.4     <3.3  Low Risk                         4.0      3.8  Average Risk                     5.0      4.5  Moderate Risk (2X Average risk)  9.5      7.0  High Risk (3X Average Risk)      >23.0    >11.0     05/08/2020 2.9 0.0 - 5.0 ratio Final     Comment:                                       T. Chol/HDL Ratio                                              Men  Women  1/2 Avg.Risk  3.4    3.3                                    Avg.Risk  5.0    4.4                                 2X  Avg.Risk  9.6    7.1                                 3X Avg.Risk 23.4   11.0       Lab Results   Component Value Date    TSH 2.54 11/23/2020     Lab Results   Component Value Date    CA 9.1 01/05/2021    PHOS 3.2 01/05/2021     Lab Results   Component Value Date    IRON 28 (L) 11/23/2020    TIBC 276 11/23/2020     Lab Results   Component Value Date    HGBA1C 5.9 11/23/2020       Rads:   Radiological Procedure reviewed.    Radiology Results (24 Hour)       ** No results found for the last 24 hours. **            Assessment and Plan:   2 months post Laparoscopic Sleeve Gastrectomy  and the patient is doing well with  52 lbs weight loss and reported dietary compliance.     1. Increase monitoring and measuring portions. Continue  protein and Continue fluid intake for a minimum of 64 oz daily.   2. Continue exercise as tolerated.  3. Follow up with PCP regularly.   4. Medications reviewed. Continue vitamins.    5. Routine care in 3 months with labs or sooner if needed (6 months post op)  6. Once he has completed his 3 month post op labs, we will review      Signed by: Gaylene Brooks, MD,  Bariatric Surgery Fellow

## 2021-03-12 NOTE — Progress Notes (Signed)
Verbal consent has been obtained from the patient to conduct a video visit encounter to minimize exposure to COVID-19: YES      S:  Pt presents for f/u visit after SG surgery.  Pt states tolerating soft diet phase well.  Has been on soft/mushy phase since 01/25/21.    Pt reports taking the following vitamin/mineral supplements: MVI BID, calcium BID, iron, B12, B50, vit D, vit A    Pt also reports consuming 50+ grams protein daily from a supplemental source of Isopure.    Pt reports being able to consume about 64 oz clear fluids daily.  Pt states they have begun compliance with the 30/30 rule during meal times.    Pt reports current portion sizes are:  2oz per food scale.      GI: some nausea and abd cramping if he forgets to eat,or with plain water.     Diet Recall:     Breakfast: deli [place]   Lunch: chicken    Dinner: protein shake, as he often forgets to have dinner or will have chicken and vegetables   Snacks: 10am protein shake 4pm protein shake     Beverages: low sugar cranberry drink (4oz), water    Pt states currently participating in the following activities for exercise:  starting to work with a Psychologist, educational.      O:  Today's Wt:     Previous Wt:   Wt Readings from Last 10 Encounters:   03/08/21 (!) 324 lb 12.8 oz (>99 %, Z= 3.28)*   01/19/21 (!) 353 lb 11.2 oz (>99 %, Z= 3.50)*   01/19/21 (!) 349 lb (>99 %, Z= 3.46)*   01/04/21 (!) 368 lb 1.6 oz (>99 %, Z= 3.60)*   12/30/20 (!) 373 lb (>99 %, Z= 3.63)*   12/28/20 (!) 373 lb (>99 %, Z= 3.63)*   12/18/20 (!) 371 lb (>99 %, Z= 3.61)*   11/23/20 (!) 371 lb (>99 %, Z= 3.61)*   11/20/20 (!) 371 lb 14.4 oz (>99 %, Z= 3.61)*   10/30/20 (!) 379 lb (>99 %, Z= 3.66)*     * Growth percentiles are based on CDC (Boys, 2-20 Years) data.      Total Wt Loss: 44lb BMI:  There is no height or weight on file to calculate BMI.  Allergy:  No Known Allergies        A:  Pt current wt loss is appropriate.  Per diet recall, pt portion sizes at meal times are adequate and food choices are  appropriate - high in lean protein and produce.  Pt is drinking adequate amounts of clear fluids daily.  Pt is progressing well with physical activity.  Pt verbalizes comprehension and desired compliance with solid diet phase.  Discussed protein goals, especially now that he is strength training. Suggested setting reminders to eat so he does not forget to have dinner.    Has the patient been admitted to a hospital other than [facility] within the first 30 days following surgery?  no  Has the patient had a re-operation or intervention performed by a health care provider other than their surgeon within the first 30 days following surgery?   no  Has the patient been treated by a health care provider other than their surgeon for any complication within the first 30 days following surgery? (including ED visits)  Yes; ED for dehydration n/v [facility]  P:  1.  Pt to advance diet, as tolerated, to solids.  Reviewed solid diet guidelines  and meal/food options reviewed.  Pt also encouraged to continue measuring all portions.  2.  Advance activity as tolerated.  3.  Pt to f/u in 4 months or PRN.  Contact information provided for pt in case of questions or concerns.    Spent a total of 15 minutes educating pt in an individual one-on-one setting.  Plan reviewed with surgeon.

## 2021-03-20 ENCOUNTER — Other Ambulatory Visit (INDEPENDENT_AMBULATORY_CARE_PROVIDER_SITE_OTHER): Payer: Self-pay | Admitting: Family Medicine

## 2021-03-20 DIAGNOSIS — F411 Generalized anxiety disorder: Secondary | ICD-10-CM

## 2021-03-20 DIAGNOSIS — F322 Major depressive disorder, single episode, severe without psychotic features: Secondary | ICD-10-CM

## 2021-04-06 ENCOUNTER — Other Ambulatory Visit (INDEPENDENT_AMBULATORY_CARE_PROVIDER_SITE_OTHER): Payer: Self-pay | Admitting: Family Medicine

## 2021-04-06 DIAGNOSIS — F411 Generalized anxiety disorder: Secondary | ICD-10-CM

## 2021-04-06 DIAGNOSIS — F322 Major depressive disorder, single episode, severe without psychotic features: Secondary | ICD-10-CM

## 2021-04-07 ENCOUNTER — Encounter (INDEPENDENT_AMBULATORY_CARE_PROVIDER_SITE_OTHER): Payer: Self-pay | Admitting: Family Medicine

## 2021-04-07 ENCOUNTER — Ambulatory Visit (INDEPENDENT_AMBULATORY_CARE_PROVIDER_SITE_OTHER): Payer: Commercial Managed Care - POS | Admitting: Family Medicine

## 2021-04-07 DIAGNOSIS — F411 Generalized anxiety disorder: Secondary | ICD-10-CM

## 2021-04-07 DIAGNOSIS — I10 Essential (primary) hypertension: Secondary | ICD-10-CM

## 2021-04-07 DIAGNOSIS — F322 Major depressive disorder, single episode, severe without psychotic features: Secondary | ICD-10-CM

## 2021-04-07 DIAGNOSIS — K219 Gastro-esophageal reflux disease without esophagitis: Secondary | ICD-10-CM

## 2021-04-07 MED ORDER — LISINOPRIL 20 MG PO TABS
20.0000 mg | ORAL_TABLET | Freq: Every day | ORAL | 1 refills | Status: DC
Start: 2021-04-07 — End: 2021-06-22

## 2021-04-07 MED ORDER — LANSOPRAZOLE 30 MG PO CPDR
30.0000 mg | DELAYED_RELEASE_CAPSULE | Freq: Every day | ORAL | 0 refills | Status: DC
Start: 2021-04-07 — End: 2021-11-13

## 2021-04-07 MED ORDER — BUPROPION HCL ER (XL) 150 MG PO TB24
150.0000 mg | ORAL_TABLET | Freq: Every day | ORAL | 1 refills | Status: DC
Start: 2021-04-07 — End: 2021-07-12

## 2021-04-07 MED ORDER — ALPRAZOLAM 0.5 MG PO TABS
0.5000 mg | ORAL_TABLET | Freq: Three times a day (TID) | ORAL | 1 refills | Status: DC | PRN
Start: 2021-04-07 — End: 2021-07-04

## 2021-04-07 MED ORDER — VENLAFAXINE HCL ER 75 MG PO CP24
75.0000 mg | ORAL_CAPSULE | Freq: Every day | ORAL | 1 refills | Status: DC
Start: 2021-04-07 — End: 2021-09-14

## 2021-04-07 NOTE — Progress Notes (Signed)
[facility] [place] [facility]                       Date of Exam: 04/07/2021 1:56 PM        Patient ID: Jason Ponce is a 20 y.o. male.  Attending Physician: Arvilla Meres, MD        Chief Complaint:    Chief Complaint   Patient presents with    4 Week Med F/up      Venlafaxine was adjusted at last appt - pt reported to see improvement                HPI:    20yo M with morbid obesity s/p lap gastric sleeve, HTN, anxiety, depression, and ADHD here to follow up on his medication dosage change. We reduced his effexor from 150 to 75 at his last visit because he was starting to experience nausea, sweating, and emotional blunting.  His side effects have resolved after decreasing the dose.  His mood is reasonably controlled.  He needs his xanax about once per week    With regards to his PPI, he is finding that the further out he is from surgery, the less heartburn issues he's having.            Problem List:    Patient Active Problem List   Diagnosis    Attention deficit hyperactivity disorder, combined type    Generalized anxiety disorder    Migraine    Moderately severe major depression    Morbid obesity    Gastroesophageal reflux disease without esophagitis    Benign essential hypertension    H/O gastric sleeve    BMI 45.0-49.9, adult             Current Meds:    Outpatient Medications Marked as Taking for the 04/07/21 encounter (Office Visit) with Arvilla Meres, MD   Medication Sig Dispense Refill    Ascorbic Acid (VITAMIN C PO) Take by mouth daily      B Complex-Biotin-FA (VITAMIN B50 COMPLEX PO) Take by mouth daily      Ferrous Sulfate (IRON PO) Take by mouth daily      Multiple Vitamin (multivitamin) tablet Take 1 tablet by mouth daily      [DISCONTINUED] ALPRAZolam (XANAX) 0.5 MG tablet Take 1 tablet (0.5 mg total) by mouth 3 (three) times daily as needed for Sleep or Anxiety 30 tablet 1    [DISCONTINUED] buPROPion XL (WELLBUTRIN XL) 150 MG 24 hr tablet Take 1 tablet (150 mg  total) by mouth daily (Patient taking differently: Take 150 mg by mouth nightly) 90 tablet 1    [DISCONTINUED] lansoprazole (PREVACID) 30 MG capsule Take 1 capsule (30 mg total) by mouth daily 90 capsule 0    [DISCONTINUED] lisinopril (ZESTRIL) 20 MG tablet Take 1 tablet (20 mg total) by mouth daily (Patient taking differently: Take 20 mg by mouth nightly) 90 tablet 1    [DISCONTINUED] venlafaxine (Effexor XR) 75 MG 24 hr capsule Take 2 capsules (150 mg total) by mouth daily 30 capsule 0          Allergies:    No Known Allergies          Past Surgical History:    Past Surgical History:   Procedure Laterality Date    EGD, BIOPSY N/A 09/16/2020    Procedure: EGD, BIOPSY;  Surgeon: Tommye Standard, MD;  Location: [facility];  Service: General;  Laterality: N/A;    LAPAROSCOPIC, GASTRECTOMY SLEEVE N/A 01/04/2021    Procedure: LAPAROSCOPIC, GASTRECTOMY SLEEVE and Omentopexy;  Surgeon: Delorse Lek, MD;  Location: [facility];  Service: General;  Laterality: N/A;           Family History:    Family History   Problem Relation Age of Onset    Obesity Other     Diabetes Other     Esophageal cancer Neg Hx     Inflammatory bowel disease Neg Hx     Stomach cancer Neg Hx            Social History:    Social History     Tobacco Use    Smoking status: Never    Smokeless tobacco: Never   Vaping Use    Vaping Use: Never used   Substance Use Topics    Alcohol use: Never    Drug use: Not Currently     Types: Benzodiazepines     Comment: H/O Vyvanse use - currently takes Alprazolam            The following sections were reviewed this encounter by the provider:   Tobacco   Allergies   Meds   Problems   Med Hx   Surg Hx   Fam Hx                Vital Signs:    BP 127/78 (BP Site: Left arm, Patient Position: Sitting, Cuff Size: Large)    Pulse 70    Temp 98.3 F (36.8 C) (Tympanic)    Ht 1.778 m (5\' 10" )    Wt (!) 146.4 kg (322 lb 11.2 oz)    BMI 46.30 kg/m          ROS:    Review of Systems   Constitutional:  Negative for  chills and fever.   Respiratory:  Negative for shortness of breath.    Cardiovascular:  Negative for chest pain and palpitations.   Gastrointestinal:  Negative for abdominal pain, diarrhea and vomiting.            Physical Exam:    Physical Exam  Constitutional:       General: He is not in acute distress.     Appearance: He is obese. He is not ill-appearing.   HENT:      Head: Normocephalic and atraumatic.   Eyes:      Extraocular Movements: Extraocular movements intact.      Conjunctiva/sclera: Conjunctivae normal.      Pupils: Pupils are equal, round, and reactive to light.   Cardiovascular:      Rate and Rhythm: Normal rate and regular rhythm.   Pulmonary:      Effort: Pulmonary effort is normal.      Breath sounds: Normal breath sounds.   Abdominal:      General: Bowel sounds are normal. There is no distension.      Palpations: Abdomen is soft.      Tenderness: There is no abdominal tenderness.   Musculoskeletal:      Right lower leg: No edema.      Left lower leg: No edema.   Neurological:      Mental Status: He is alert.            Assessment:    1. Generalized anxiety disorder  - venlafaxine (Effexor XR) 75 MG 24 hr capsule; Take 1 capsule (75 mg total) by mouth daily  Dispense: 90 capsule; Refill:  1  - ALPRAZolam (XANAX) 0.5 MG tablet; Take 1 tablet (0.5 mg total) by mouth 3 (three) times daily as needed for Sleep or Anxiety  Dispense: 30 tablet; Refill: 1    2. Moderately severe major depression  - venlafaxine (Effexor XR) 75 MG 24 hr capsule; Take 1 capsule (75 mg total) by mouth daily  Dispense: 90 capsule; Refill: 1  - buPROPion XL (WELLBUTRIN XL) 150 MG 24 hr tablet; Take 1 tablet (150 mg total) by mouth daily  Dispense: 90 tablet; Refill: 1    3. Benign essential hypertension  - lisinopril (ZESTRIL) 20 MG tablet; Take 1 tablet (20 mg total) by mouth daily  Dispense: 90 tablet; Refill: 1    4. Gastroesophageal reflux disease without esophagitis  - lansoprazole (PREVACID) 30 MG capsule; Take 1 capsule  (30 mg total) by mouth daily  Dispense: 90 capsule; Refill: 0          Plan:    Chronic issues discussed  Reduced dose of effexor has eliminated side effects  HTN is controlled  Recommend trial of weaning prevacid to pepcid  Medications refilled for 6 months              Follow-up:    Return in about 6 months (around 10/08/2021) for Wellness Exam.         Larissa Pegg Sloan Leiter, MD

## 2021-04-12 ENCOUNTER — Other Ambulatory Visit (FREE_STANDING_LABORATORY_FACILITY): Payer: Commercial Managed Care - POS

## 2021-04-12 DIAGNOSIS — Z09 Encounter for follow-up examination after completed treatment for conditions other than malignant neoplasm: Secondary | ICD-10-CM

## 2021-04-12 DIAGNOSIS — Z713 Dietary counseling and surveillance: Secondary | ICD-10-CM

## 2021-04-12 DIAGNOSIS — Z1321 Encounter for screening for nutritional disorder: Secondary | ICD-10-CM

## 2021-04-12 DIAGNOSIS — Z9884 Bariatric surgery status: Secondary | ICD-10-CM

## 2021-04-12 LAB — COMPREHENSIVE METABOLIC PANEL
ALT: 26 U/L (ref 0–55)
AST (SGOT): 18 U/L (ref 5–34)
Albumin/Globulin Ratio: 0.9 (ref 0.9–2.2)
Albumin: 3.5 g/dL (ref 3.5–5.0)
Alkaline Phosphatase: 107 U/L (ref 65–260)
Anion Gap: 8 (ref 5.0–15.0)
BUN: 6 mg/dL — ABNORMAL LOW (ref 9.0–28.0)
Bilirubin, Total: 0.4 mg/dL (ref 0.2–1.2)
CO2: 23 mEq/L (ref 21–29)
Calcium: 9.5 mg/dL (ref 8.8–10.8)
Chloride: 105 mEq/L (ref 100–111)
Creatinine: 0.7 mg/dL (ref 0.5–1.5)
Globulin: 3.8 g/dL — ABNORMAL HIGH (ref 2.0–3.6)
Glucose: 84 mg/dL (ref 70–100)
Potassium: 3.9 mEq/L (ref 3.5–5.1)
Protein, Total: 7.3 g/dL (ref 6.0–8.3)
Sodium: 136 mEq/L (ref 136–145)

## 2021-04-12 LAB — HEMOLYSIS INDEX: Hemolysis Index: 3 Index (ref 0–24)

## 2021-04-12 LAB — CBC
Absolute NRBC: 0 10*3/uL (ref 0.00–0.00)
Hematocrit: 39.9 % (ref 37.6–49.6)
Hgb: 12.2 g/dL — ABNORMAL LOW (ref 12.5–17.1)
MCH: 25.8 pg (ref 25.1–33.5)
MCHC: 30.6 g/dL — ABNORMAL LOW (ref 31.5–35.8)
MCV: 84.5 fL (ref 78.0–96.0)
MPV: 12.4 fL (ref 8.9–12.5)
Nucleated RBC: 0 /100 WBC (ref 0.0–0.0)
Platelets: 302 10*3/uL (ref 142–346)
RBC: 4.72 10*6/uL (ref 4.20–5.90)
RDW: 15 % (ref 11–15)
WBC: 9.05 10*3/uL (ref 3.10–9.50)

## 2021-04-12 LAB — IRON PROFILE
Iron Saturation: 15 % (ref 15–50)
Iron: 38 ug/dL — ABNORMAL LOW (ref 40–160)
TIBC: 253 ug/dL — ABNORMAL LOW (ref 261–462)
UIBC: 215 ug/dL (ref 126–382)

## 2021-04-12 LAB — LIPID PANEL
Cholesterol / HDL Ratio: 3.7 Index
Cholesterol: 166 mg/dL (ref 0–199)
HDL: 45 mg/dL (ref 40–9999)
LDL Calculated: 107 mg/dL — ABNORMAL HIGH (ref 0–99)
Triglycerides: 69 mg/dL (ref 34–149)
VLDL Calculated: 14 mg/dL (ref 10–40)

## 2021-04-12 LAB — HEMOGLOBIN A1C
Average Estimated Glucose: 105.4 mg/dL
Hemoglobin A1C: 5.3 % (ref 4.6–5.9)

## 2021-04-12 LAB — FOLATE: Folate: 3.7 ng/mL

## 2021-04-12 LAB — VITAMIN B12: Vitamin B-12: 489 pg/mL (ref 211–911)

## 2021-04-12 LAB — VITAMIN D,25 OH,TOTAL: Vitamin D, 25 OH, Total: 25 ng/mL — ABNORMAL LOW (ref 30–100)

## 2021-04-14 LAB — VITAMIN A: Vitamin A: 26.3 ug/dL — ABNORMAL LOW (ref 32.5–78.0)

## 2021-04-15 LAB — VITAMIN B1, WHOLE BLOOD: Whole Blood Vitamin B1: 103 nmol/L (ref 70–180)

## 2021-04-20 ENCOUNTER — Ambulatory Visit (HOSPITAL_BASED_OUTPATIENT_CLINIC_OR_DEPARTMENT_OTHER): Payer: Commercial Managed Care - POS | Admitting: Family

## 2021-04-23 ENCOUNTER — Telehealth (INDEPENDENT_AMBULATORY_CARE_PROVIDER_SITE_OTHER): Payer: Commercial Managed Care - POS | Admitting: Family

## 2021-04-23 VITALS — Ht 71.0 in | Wt 315.0 lb

## 2021-04-23 DIAGNOSIS — Z9884 Bariatric surgery status: Secondary | ICD-10-CM

## 2021-04-23 DIAGNOSIS — E569 Vitamin deficiency, unspecified: Secondary | ICD-10-CM

## 2021-04-23 DIAGNOSIS — E559 Vitamin D deficiency, unspecified: Secondary | ICD-10-CM

## 2021-04-23 DIAGNOSIS — Z6841 Body Mass Index (BMI) 40.0 and over, adult: Secondary | ICD-10-CM

## 2021-04-23 DIAGNOSIS — Z68.41 Body mass index (BMI) pediatric, 85th percentile to less than 95th percentile for age: Secondary | ICD-10-CM

## 2021-04-23 DIAGNOSIS — Z1321 Encounter for screening for nutritional disorder: Secondary | ICD-10-CM

## 2021-04-23 DIAGNOSIS — E509 Vitamin A deficiency, unspecified: Secondary | ICD-10-CM

## 2021-04-23 DIAGNOSIS — Z713 Dietary counseling and surveillance: Secondary | ICD-10-CM

## 2021-04-23 MED ORDER — ERGOCALCIFEROL 1.25 MG (50000 UT) PO CAPS
50000.0000 [IU] | ORAL_CAPSULE | ORAL | 0 refills | Status: AC
Start: 2021-04-23 — End: 2021-07-10

## 2021-04-23 NOTE — Progress Notes (Unsigned)
Progress Note   Patient Name: Jason Ponce, Jason Ponce  Age: 20 y.o.  Sex: male   DOB: 2001/06/16  MRN: [id number]    Verbal consent has been obtained from the patient to conduct a video and telephone visit to minimize exposure to COVID-19: yes.  Platform utilized: Therapist, music    Subjective:   Jason Ponce returns for follow up 3 months post Laparoscopic Sleeve Gastrectomy .   He is doing well overall. He complains of feeling nauseated and having mild  gastric abdominal pain when he goes for long periods of fasting. Denies any vomiting, reflux, diarrhea, constipation, or other abdominal pain.   Portion sizes are measured @ 4 oz.  Daily fluid intake is @ about 64 oz.  Protein supplements intake is @ 30-60 grams daily.   Energy level is good and exercise consists of weights and some cardio 2-3 days a week is his goal.  He is happy with his weight loss to date and has had 57 lbs total weight loss.    Past Medical History:     Past Medical History:   Diagnosis Date    Anxiety     Attention deficit hyperactivity disorder (ADHD)     Claustrophobia     COVID-19 vaccine series completed     completed 2 doses Pfizer vaccine    Depression     Dyspnea on exertion 2014    Gastritis     EGD 2021    Gastroesophageal reflux disease     occasional    Hypertension 2019    stable on meds    Migraine 2010    Morbid obesity     BMI 50.6        Past Surgical History:     Past Surgical History:   Procedure Laterality Date    EGD, BIOPSY N/A 09/16/2020    Procedure: EGD, BIOPSY;  Surgeon: Tommye Standard, MD;  Location: [facility];  Service: General;  Laterality: N/A;    LAPAROSCOPIC, GASTRECTOMY SLEEVE N/A 01/04/2021    Procedure: LAPAROSCOPIC, GASTRECTOMY SLEEVE and Omentopexy;  Surgeon: Delorse Lek, MD;  Location: [facility];  Service: General;  Laterality: N/A;       Family History:     Family History   Problem Relation Age of Onset    Obesity Other     Diabetes Other     Esophageal cancer Neg Hx     Inflammatory bowel  disease Neg Hx     Stomach cancer Neg Hx        Allergies:   No Known Allergies    Medications:     Prior to Admission medications    Medication Sig Start Date End Date Taking? Authorizing Provider   ALPRAZolam Prudy Feeler) 0.5 MG tablet Take 1 tablet (0.5 mg total) by mouth 3 (three) times daily as needed for Sleep or Anxiety 04/07/21  Yes Arvilla Meres, MD   Ascorbic Acid (VITAMIN C PO) Take by mouth daily   Yes [provider]   B Complex-Biotin-FA (VITAMIN B50 COMPLEX PO) Take by mouth daily   Yes [provider]   buPROPion XL (WELLBUTRIN XL) 150 MG 24 hr tablet Take 1 tablet (150 mg total) by mouth daily 04/07/21  Yes Yi, Dustin Folks, MD   Ferrous Sulfate (IRON PO) Take by mouth daily   Yes [provider]   lansoprazole (PREVACID) 30 MG capsule Take 1 capsule (30 mg total) by mouth daily 04/07/21  Yes Arvilla Meres, MD  lisinopril (ZESTRIL) 20 MG tablet Take 1 tablet (20 mg total) by mouth daily 04/07/21 10/04/21 Yes Yi, Dustin Folks, MD   Multiple Vitamin (multivitamin) tablet Take 1 tablet by mouth daily   Yes [provider]   venlafaxine (Effexor XR) 75 MG 24 hr capsule Take 1 capsule (75 mg total) by mouth daily 04/07/21  Yes Arvilla Meres, MD       Review of Systems:   All systems reviewed and negative except as noted in the subjective.     Physical Exam:   Ht 5\' 11"    Wt (!) 315 lb   BMI 43.93 kg/m     BMI: Body mass index is 43.93 kg/m.  Previous Weight:   Wt Readings from Last 15 Encounters:   04/23/21 (!) 315 lb (>99 %, Z= 3.19)*   04/07/21 (!) 322 lb 11.2 oz (>99 %, Z= 3.26)*   03/12/21 (!) 324 lb (>99 %, Z= 3.27)*   03/12/21 (!) 324 lb 14.4 oz (>99 %, Z= 3.28)*   03/08/21 (!) 324 lb 12.8 oz (>99 %, Z= 3.28)*   01/19/21 (!) 353 lb 11.2 oz (>99 %, Z= 3.50)*   01/19/21 (!) 349 lb (>99 %, Z= 3.46)*   01/04/21 (!) 368 lb 1.6 oz (>99 %, Z= 3.60)*   12/30/20 (!) 373 lb (>99 %, Z= 3.63)*   12/28/20 (!) 373 lb (>99 %, Z= 3.63)*   12/18/20 (!) 371 lb (>99 %, Z= 3.61)*   11/23/20 (!) 371  lb (>99 %, Z= 3.61)*   11/20/20 (!) 371 lb 14.4 oz (>99 %, Z= 3.61)*   10/30/20 (!) 379 lb (>99 %, Z= 3.66)*   10/23/20 (!) 378 lb 12.8 oz (>99 %, Z= 3.65)*     * Growth percentiles are based on CDC (Boys, 2-20 Years) data.      General: No distress, well appearing  HEENT: Normocephalic, anicteric sclera, moist mucous membrane.  Lungs: no respiratory distress, speaks in full sentences  Abdominal: Reports no significant abdominal pain  Extremities: No reported edema  Neurologic: Alert and oriented x 3   Psych: Cooperative and pleasant, mood and affect proper, responds to questions appropriately    Labs Reviewed:     Lab Results   Component Value Date    WBC 9.05 04/12/2021    HGB 12.2 (L) 04/12/2021    HCT 39.9 04/12/2021    MCV 84.5 04/12/2021    PLT 302 04/12/2021     '  Chemistry        Component Value Date/Time    NA 136 04/12/2021 0959    K 3.9 04/12/2021 0959    CL 105 04/12/2021 0959    CO2 23 04/12/2021 0959    BUN 6.0 (L) 04/12/2021 0959    CREAT 0.7 04/12/2021 0959    CREAT 0.74 (L) 05/08/2020 1411    GLU 84 04/12/2021 0959        Component Value Date/Time    CA 9.5 04/12/2021 0959    ALKPHOS 107 04/12/2021 0959    AST 18 04/12/2021 0959    ALT 26 04/12/2021 0959    BILITOTAL 0.4 04/12/2021 0959          Lab Results   Component Value Date    ALT 26 04/12/2021    AST 18 04/12/2021    ALKPHOS 107 04/12/2021    BILITOTAL 0.4 04/12/2021     Lab Results   Component Value Date    VITD 25 (L) 04/12/2021  Lab Results   Component Value Date    B12 489 04/12/2021     No results found for: VITAMINB1  No results found for: VITAMINARETI  Cholesterol   Date Value Ref Range Status   04/12/2021 166 0 - 199 mg/dL Final   16/06/9603 540 0 - 199 mg/dL Final   98/07/9146 829 100 - 169 mg/dL Final     Triglycerides   Date Value Ref Range Status   04/12/2021 69 34 - 149 mg/dL Final   56/21/3086 578 34 - 149 mg/dL Final   46/96/2952 66 0 - 89 mg/dL Final     HDL   Date Value Ref Range Status   04/12/2021 45 40 - 9,999 mg/dL  Final     Comment:     An HDL cholesterol <40 mg/dL is low and constitutes a  coronary heart disease risk factor, and HDL-C>59 mg/dL is  a negative risk factor for CHD.  Ref: American Heart Association; Circulation 2004     11/23/2020 54 40 - 9,999 mg/dL Final     Comment:     An HDL cholesterol <40 mg/dL is low and constitutes a  coronary heart disease risk factor, and HDL-C>59 mg/dL is  a negative risk factor for CHD.  Ref: American Heart Association; Circulation 2004     05/08/2020 54 >39 mg/dL Final     Cholesterol / HDL Ratio   Date Value Ref Range Status   04/12/2021 3.7 See Below Index Final     Comment:     Chol/HDL Ratio:  Classification                   Male     Male  Very Low (1/2 Average Risk)      <3.4     <3.3  Low Risk                         4.0      3.8  Average Risk                     5.0      4.5  Moderate Risk (2X Average risk)  9.5      7.0  High Risk (3X Average Risk)      >23.0    >11.0     11/23/2020 3.0 See Below Final     Comment:     Chol/HDL Ratio:  Classification                   Male     Male  Very Low (1/2 Average Risk)      <3.4     <3.3  Low Risk                         4.0      3.8  Average Risk                     5.0      4.5  Moderate Risk (2X Average risk)  9.5      7.0  High Risk (3X Average Risk)      >23.0    >11.0     05/08/2020 2.9 0.0 - 5.0 ratio Final     Comment:  T. Chol/HDL Ratio                                              Men  Women                                1/2 Avg.Risk  3.4    3.3                                    Avg.Risk  5.0    4.4                                 2X Avg.Risk  9.6    7.1                                 3X Avg.Risk 23.4   11.0       Lab Results   Component Value Date    TSH 2.54 11/23/2020     Lab Results   Component Value Date    CA 9.5 04/12/2021    PHOS 3.2 01/05/2021     Lab Results   Component Value Date    IRON 38 (L) 04/12/2021    TIBC 253 (L) 04/12/2021     Lab Results   Component Value  Date    HGBA1C 5.3 04/12/2021       Rads:   Radiological Procedure reviewed.    Radiology Results (24 Hour)       ** No results found for the last 24 hours. **            Assessment and Plan:   3 months post Laparoscopic Sleeve Gastrectomy  and the patient has lost 57 lbs.  He is doing well with  reported dietary compliance and lifestyle changes.     Labs were reviewed:  Begin vitamin A 10,000 units OR 3000 mcg daily. The best time to take it is following a meal with some fat in it like avocado, salmon, cheese, nuts, olives or olive oil, etc. Vitamin A is very important for eye health and vision and found in foods like liver, yams, carrots and kale, among others. Again, have those foods with some fat to ensure absorption of the vitamin A.    Begin 50,000 units ergocalciferol (vitamin D) once a week for 12 weeks. After that is done you should begin vitamin D3 5000 units daily. Vitamin D is important for bone health but also for immunity.        We will continue to monitor your labs and nutritional status at regular intervals, typically at 3, 6, 9, 12, 18 and 24 months after surgery.   I have entered fasting lab orders to be completed before next visit: Fasting labs before next visit. HOLD vitamins 24 hours prior to labs.     Three Month post op millstones were discussed:   Regular diet -  encouraged to choose mostly high-protein foods and to avoid foods that are usually not well tolerated. You will continue practicing all the good eating habits you learned as you were preparing for surgery.  Foods to  avoid: rice, bread, pasta, dry/tough meats, high-sugar foods, andhigh-fat foods. At  3-6 months post op you should be consuming 3 oz  or 1/3 cup at each meal unless other instructed by your RD.  Beverages to avoid: carbonated beverages, whole or 2% milk, fruit juice, smoothies, energy drinks, caffeine in excess, and alcohol.      1. Continue monitoring and measuring portions. Continue protein from supplements daily and  continue fluid intake for a minimum of 64 oz daily.   2. Increase exercise as tolerated.  3. Follow up with PCP regularly.   4. Medications reviewed. Continue vitamins with any changes as noted above.    5. Routine care in 3 months with labs or sooner if needed.     *Note general follow up recommendations:   2 weeks post op: NP/MD and RD  6 weeks post op: RD  12 weeks post op: NP and RD  6, 9, and 12 months post op: NP and RD  18 and 24 months post op: NP and RD  Depending on progress at 24 months you may be followed every 6 months or yearly  Meet with behavior health specialist at 6 and 12 months for support, and more frequently if necessary      Over half, 50%, of the time of face to face interaction was spent discussing and counseling the patient on their bariatric follow up, weight loss, life style modification, post operative and life long management concerns.     Return for Marshia Ly, NP, with labs, Registered Dietitian.    Signed by: Lonna Cobb, NP, NP-C    This note was generated by the [employer] EMR system/Dragon speech recognition and may contain inherent errors or omissions not intended by the user. Grammatical errors, random word insertions, deletions, pronoun errors and incomplete sentences are occasional consequences of this technology due to software limitations. Not all errors are caught or corrected. If there are questions or concerns about the content of this note or information contained within the body of this dictation they should be addressed directly with the author for clarification.

## 2021-04-25 ENCOUNTER — Encounter (HOSPITAL_BASED_OUTPATIENT_CLINIC_OR_DEPARTMENT_OTHER): Payer: Self-pay | Admitting: Family

## 2021-06-01 ENCOUNTER — Encounter (INDEPENDENT_AMBULATORY_CARE_PROVIDER_SITE_OTHER): Payer: Self-pay | Admitting: Family Medicine

## 2021-06-01 ENCOUNTER — Ambulatory Visit (INDEPENDENT_AMBULATORY_CARE_PROVIDER_SITE_OTHER): Payer: Medicaid Other | Admitting: Family Medicine

## 2021-06-01 VITALS — BP 132/78 | HR 77 | Temp 98.1°F | Ht 71.0 in | Wt 311.0 lb

## 2021-06-01 DIAGNOSIS — I10 Essential (primary) hypertension: Secondary | ICD-10-CM

## 2021-06-01 DIAGNOSIS — Z6841 Body Mass Index (BMI) 40.0 and over, adult: Secondary | ICD-10-CM

## 2021-06-01 DIAGNOSIS — R61 Generalized hyperhidrosis: Secondary | ICD-10-CM

## 2021-06-01 DIAGNOSIS — Z68.41 Body mass index (BMI) pediatric, greater than or equal to 95th percentile for age: Secondary | ICD-10-CM

## 2021-06-01 MED ORDER — GLYCOPYRROLATE 2 MG PO TABS
2.0000 mg | ORAL_TABLET | Freq: Three times a day (TID) | ORAL | 5 refills | Status: DC
Start: 2021-06-01 — End: 2021-06-04

## 2021-06-01 NOTE — Progress Notes (Signed)
[facility] [place] [facility]                       Date of Exam: 06/01/2021 2:45 PM        Patient ID: Jason Ponce is a 20 y.o. male.  Attending Physician: Arvilla Meres, MD        Chief Complaint:    Chief Complaint   Patient presents with    CC f/u     Flu Vaccine               HPI:    20yo M with morbid obesity s/p lap gastric sleeve, HTN, anxiety, depression, and ADHD here for chronic care management.  Pt is actually not yet due for f/u.  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  -Mainly on the face and hands  -pt has tried dietary changes, topical antiperspirants, looser fitting clothes    # HTN:  -On lisinopril 20  -Diet:  -Exercise:  -Home BPs: not checking recently    # Anxiety  -On venlafaxine 75, bupropion XL 150, and PRN                 Problem List:    Patient Active Problem List   Diagnosis    Attention deficit hyperactivity disorder, combined type    Generalized anxiety disorder    Migraine    Moderately severe major depression    Morbid obesity    Gastroesophageal reflux disease without esophagitis    Benign essential hypertension    H/O gastric sleeve    BMI 45.0-49.9, adult    Hyperhidrosis             Current Meds:    Outpatient Medications Marked as Taking for the 06/01/21 encounter (Office Visit) with Arvilla Meres, MD   Medication Sig Dispense Refill    ALPRAZolam (XANAX) 0.5 MG tablet Take 1 tablet (0.5 mg total) by mouth 3 (three) times daily as needed for Sleep or Anxiety 30 tablet 1    B Complex-Biotin-FA (VITAMIN B50 COMPLEX PO) Take by mouth daily      buPROPion XL (WELLBUTRIN XL) 150 MG 24 hr tablet Take 1 tablet (150 mg total) by mouth daily 90 tablet 1    Ferrous Sulfate (IRON PO) Take by mouth daily      lansoprazole (PREVACID) 30 MG capsule Take 1 capsule (30 mg total) by mouth daily 90 capsule 0    lisinopril (ZESTRIL) 20 MG tablet Take 1 tablet (20 mg total) by mouth  daily 90 tablet 1    Multiple Vitamin (multivitamin) tablet Take 1 tablet by mouth daily      venlafaxine (Effexor XR) 75 MG 24 hr capsule Take 1 capsule (75 mg total) by mouth daily 90 capsule 1          Allergies:    No Known Allergies          Past Surgical History:    Past Surgical History:   Procedure Laterality Date    EGD, BIOPSY N/A 09/16/2020    Procedure: EGD, BIOPSY;  Surgeon: Tommye Standard, MD;  Location: [facility];  Service: General;  Laterality: N/A;    LAPAROSCOPIC, GASTRECTOMY SLEEVE N/A 01/04/2021    Procedure: LAPAROSCOPIC, GASTRECTOMY SLEEVE and Omentopexy;  Surgeon: Delorse Lek, MD;  Location: [facility];  Service: General;  Laterality: N/A;           Family History:    Family History   Problem Relation Age of Onset    Obesity Other     Diabetes Other     Esophageal cancer Neg Hx     Inflammatory bowel disease Neg Hx     Stomach cancer Neg Hx            Social History:    Social History     Tobacco Use    Smoking status: Never    Smokeless tobacco: Never   Vaping Use    Vaping Use: Never used   Substance Use Topics    Alcohol use: Never    Drug use: Not Currently     Types: Benzodiazepines     Comment: H/O Vyvanse use - currently takes Alprazolam            The following sections were reviewed this encounter by the provider:   Tobacco  Allergies  Meds  Problems  Med Hx  Surg Hx  Fam Hx             Vital Signs:    BP 132/78   Pulse 77   Temp 98.1 F (36.7 C) (Tympanic)   Ht 1.803 m (5\' 11" )   Wt (!) 141.1 kg (311 lb)   BMI 43.38 kg/m          ROS:    Review of Systems   Constitutional:  Negative for chills and fever.   Respiratory:  Negative for shortness of breath.    Cardiovascular:  Negative for chest pain and palpitations.   Gastrointestinal:  Negative for abdominal pain, diarrhea and vomiting.            Physical Exam:    Physical Exam  Constitutional:       Appearance: Normal appearance. He is obese.   HENT:      Head: Normocephalic and atraumatic.      Right  Ear: External ear normal.      Left Ear: External ear normal.      Nose: Nose normal.      Mouth/Throat:      Mouth: Mucous membranes are moist.   Eyes:      Extraocular Movements: Extraocular movements intact.      Conjunctiva/sclera: Conjunctivae normal.   Pulmonary:      Effort: Pulmonary effort is normal. No respiratory distress.   Musculoskeletal:         General: Normal range of motion.      Cervical back: Normal range of motion.   Skin:     General: Skin is dry.   Neurological:      General: No focal deficit present.      Mental Status: He is alert.   Psychiatric:         Mood and Affect: Mood normal.         Behavior: Behavior normal.         Thought Content: Thought content normal.         Judgment: Judgment normal.            Assessment:    1. Hyperhidrosis  - glycopyrrolate (ROBINUL) 2 MG tablet; Take 1 tablet (2 mg total) by mouth 3 (three) times daily  Dispense: 60 tablet; Refill: 5    2. BMI 45.0-49.9, adult    3. Benign essential hypertension    4. Morbid obesity  Plan:                Follow-up:    No follow-ups on file.         Mert Dietrick Sloan Leiter, MD

## 2021-06-04 ENCOUNTER — Encounter (INDEPENDENT_AMBULATORY_CARE_PROVIDER_SITE_OTHER): Payer: Self-pay | Admitting: Family Medicine

## 2021-06-04 DIAGNOSIS — R61 Generalized hyperhidrosis: Secondary | ICD-10-CM

## 2021-06-04 MED ORDER — GLYCOPYRROLATE 2 MG PO TABS
2.0000 mg | ORAL_TABLET | Freq: Three times a day (TID) | ORAL | 5 refills | Status: DC
Start: 2021-06-04 — End: 2021-09-14

## 2021-06-22 ENCOUNTER — Other Ambulatory Visit (INDEPENDENT_AMBULATORY_CARE_PROVIDER_SITE_OTHER): Payer: Self-pay | Admitting: Family Medicine

## 2021-06-22 DIAGNOSIS — I10 Essential (primary) hypertension: Secondary | ICD-10-CM

## 2021-07-04 ENCOUNTER — Other Ambulatory Visit (INDEPENDENT_AMBULATORY_CARE_PROVIDER_SITE_OTHER): Payer: Self-pay | Admitting: Family Medicine

## 2021-07-04 DIAGNOSIS — F411 Generalized anxiety disorder: Secondary | ICD-10-CM

## 2021-07-06 MED ORDER — ALPRAZOLAM 0.5 MG PO TABS
0.5000 mg | ORAL_TABLET | Freq: Three times a day (TID) | ORAL | 1 refills | Status: DC | PRN
Start: 2021-07-06 — End: 2021-09-14

## 2021-07-12 ENCOUNTER — Other Ambulatory Visit (INDEPENDENT_AMBULATORY_CARE_PROVIDER_SITE_OTHER): Payer: Self-pay | Admitting: Family Medicine

## 2021-07-12 DIAGNOSIS — F322 Major depressive disorder, single episode, severe without psychotic features: Secondary | ICD-10-CM

## 2021-07-13 NOTE — Telephone Encounter (Signed)
Pt is requesting to have Bupropion 150mg  resent to [facility] in [place]. 90 tablets with 1 refill was sent to [facility] on [address] on 04/07/2021

## 2021-07-14 MED ORDER — BUPROPION HCL ER (XL) 150 MG PO TB24
150.0000 mg | ORAL_TABLET | Freq: Every day | ORAL | 0 refills | Status: DC
Start: 2021-07-14 — End: 2021-09-14

## 2021-08-01 ENCOUNTER — Other Ambulatory Visit (INDEPENDENT_AMBULATORY_CARE_PROVIDER_SITE_OTHER): Payer: Self-pay | Admitting: Family Medicine

## 2021-08-01 DIAGNOSIS — R61 Generalized hyperhidrosis: Secondary | ICD-10-CM

## 2021-08-05 ENCOUNTER — Telehealth (INDEPENDENT_AMBULATORY_CARE_PROVIDER_SITE_OTHER): Payer: Medicaid Other | Admitting: Family Medicine

## 2021-09-13 ENCOUNTER — Ambulatory Visit (INDEPENDENT_AMBULATORY_CARE_PROVIDER_SITE_OTHER): Payer: Medicaid Other | Admitting: Family Medicine

## 2021-09-14 ENCOUNTER — Ambulatory Visit (INDEPENDENT_AMBULATORY_CARE_PROVIDER_SITE_OTHER): Payer: Medicaid Other | Admitting: Family Medicine

## 2021-09-14 ENCOUNTER — Encounter (INDEPENDENT_AMBULATORY_CARE_PROVIDER_SITE_OTHER): Payer: Self-pay | Admitting: Family Medicine

## 2021-09-14 DIAGNOSIS — F322 Major depressive disorder, single episode, severe without psychotic features: Secondary | ICD-10-CM

## 2021-09-14 DIAGNOSIS — R61 Generalized hyperhidrosis: Secondary | ICD-10-CM

## 2021-09-14 DIAGNOSIS — F411 Generalized anxiety disorder: Secondary | ICD-10-CM

## 2021-09-14 DIAGNOSIS — I10 Essential (primary) hypertension: Secondary | ICD-10-CM

## 2021-09-14 MED ORDER — ALPRAZOLAM 0.5 MG PO TABS
0.5000 mg | ORAL_TABLET | Freq: Three times a day (TID) | ORAL | 1 refills | Status: DC | PRN
Start: 2021-09-14 — End: 2022-01-20

## 2021-09-14 MED ORDER — LISINOPRIL 20 MG PO TABS
20.0000 mg | ORAL_TABLET | Freq: Every day | ORAL | 1 refills | Status: DC
Start: 2021-09-14 — End: 2022-03-23

## 2021-09-14 MED ORDER — VENLAFAXINE HCL ER 37.5 MG PO CP24
37.5000 mg | ORAL_CAPSULE | Freq: Every day | ORAL | 1 refills | Status: DC
Start: 2021-09-14 — End: 2022-03-23

## 2021-09-14 MED ORDER — BUPROPION HCL ER (XL) 150 MG PO TB24
150.0000 mg | ORAL_TABLET | Freq: Every day | ORAL | 1 refills | Status: DC
Start: 2021-09-14 — End: 2022-02-14

## 2021-09-14 MED ORDER — GLYCOPYRROLATE 2 MG PO TABS
2.0000 mg | ORAL_TABLET | Freq: Two times a day (BID) | ORAL | 1 refills | Status: DC
Start: 2021-09-14 — End: 2022-03-23

## 2021-09-14 NOTE — Progress Notes (Signed)
[facility] [place] [facility]                       Date of Exam: 09/14/2021 2:33 PM        Patient ID: Jason Ponce is a 20 y.o. male.  Attending Physician: Arvilla Meres, MD        Chief Complaint:    Chief Complaint   Patient presents with    Chronic Care Management - Med Refill               HPI:    20yo M with morbid obesity s/p lap gastric sleeve, HTN, anxiety, depression, ADHD and hyperhidrosis here for chronic care management.        # HTN:  -On lisinopril 20  -Diet: fair, eating more on the go but keeping within his recommended caloric limits  -Exercise: regular  -Home BPs: not checking recently     # Anxiety  -On venlafaxine 75, bupropion XL 150, and PRN alprazolam (rarely needs it now)  -Pt feels he'd like to decrease his venlafaxine    # ADHD:  -Currently off of medications    # Hyperhidrosis   -Prescribed glycopyrrolate at his last visit and it has been helpful  -chronic issue that also runs in the family  -Mainly on the face and hands  -pt has tried dietary changes, topical antiperspirants, looser fitting clothes                  Problem List:    Patient Active Problem List   Diagnosis    Attention deficit hyperactivity disorder, combined type    Generalized anxiety disorder    Migraine    Moderately severe major depression    Morbid obesity    Gastroesophageal reflux disease without esophagitis    Benign essential hypertension    H/O gastric sleeve    BMI 45.0-49.9, adult    Hyperhidrosis             Current Meds:    Outpatient Medications Marked as Taking for the 09/14/21 encounter (Office Visit) with Arvilla Meres, MD   Medication Sig Dispense Refill    Ferrous Sulfate (IRON PO) Take by mouth daily      lansoprazole (PREVACID) 30 MG capsule Take 1 capsule (30 mg total) by mouth daily 90 capsule 0    Multiple Vitamin (multivitamin) tablet Take 1 tablet by mouth daily      [DISCONTINUED] ALPRAZolam (XANAX) 0.5 MG tablet Take 1 tablet (0.5 mg total) by mouth 3  (three) times daily as needed for Sleep or Anxiety 30 tablet 1    [DISCONTINUED] buPROPion XL (WELLBUTRIN XL) 150 MG 24 hr tablet Take 1 tablet (150 mg total) by mouth daily 90 tablet 0    [DISCONTINUED] glycopyrrolate (ROBINUL) 2 MG tablet Take 1 tablet (2 mg total) by mouth 3 (three) times daily 60 tablet 5    [DISCONTINUED] lisinopril (ZESTRIL) 20 MG tablet TAKE 1 TABLET(20 MG) BY MOUTH DAILY 90 tablet 0    [DISCONTINUED] venlafaxine (Effexor XR) 75 MG 24 hr capsule Take 1 capsule (75 mg total) by mouth daily 90 capsule 1          Allergies:    No Known Allergies          Past Surgical History:    Past Surgical History:   Procedure Laterality Date    EGD, BIOPSY N/A 09/16/2020    Procedure: EGD, BIOPSY;  Surgeon: Tommye Standard, MD;  Location: [facility];  Service: General;  Laterality: N/A;    LAPAROSCOPIC, GASTRECTOMY SLEEVE N/A 01/04/2021    Procedure: LAPAROSCOPIC, GASTRECTOMY SLEEVE and Omentopexy;  Surgeon: Delorse Lek, MD;  Location: [facility];  Service: General;  Laterality: N/A;           Family History:    Family History   Problem Relation Age of Onset    Obesity Other     Diabetes Other     Esophageal cancer Neg Hx     Inflammatory bowel disease Neg Hx     Stomach cancer Neg Hx            Social History:    Social History     Tobacco Use    Smoking status: Never    Smokeless tobacco: Never   Vaping Use    Vaping Use: Never used   Substance Use Topics    Alcohol use: Never    Drug use: Not Currently     Types: Benzodiazepines     Comment: H/O Vyvanse use - currently takes Alprazolam            The following sections were reviewed this encounter by the provider:   Tobacco   Allergies   Meds   Problems   Med Hx   Surg Hx   Fam Hx              Vital Signs:    BP 120/75 (BP Site: Left arm, Patient Position: Sitting, Cuff Size: Large)    Pulse 74    Temp 98.9 F (37.2 C) (Tympanic)    Ht 1.803 m (5\' 11" )    Wt (!) 137.3 kg (302 lb 9.6 oz)    BMI 42.20 kg/m          ROS:    Review of Systems    All other systems reviewed and are negative.           Physical Exam:    Physical Exam  Constitutional:       General: He is not in acute distress.     Appearance: He is obese. He is not ill-appearing.   HENT:      Head: Normocephalic and atraumatic.   Eyes:      Extraocular Movements: Extraocular movements intact.      Conjunctiva/sclera: Conjunctivae normal.      Pupils: Pupils are equal, round, and reactive to light.   Cardiovascular:      Rate and Rhythm: Normal rate and regular rhythm.   Pulmonary:      Effort: Pulmonary effort is normal.      Breath sounds: Normal breath sounds.   Abdominal:      General: Bowel sounds are normal. There is no distension.      Palpations: Abdomen is soft.      Tenderness: There is no abdominal tenderness.   Musculoskeletal:      Right lower leg: No edema.      Left lower leg: No edema.   Neurological:      Mental Status: He is alert.            Assessment:    1. Benign essential hypertension  - lisinopril (ZESTRIL) 20 MG tablet; Take 1 tablet (20 mg) by mouth daily  Dispense: 90 tablet; Refill: 1    2. Generalized anxiety disorder  - ALPRAZolam (XANAX) 0.5 MG tablet; Take 1 tablet (0.5 mg) by mouth 3 (three)  times daily as needed for Sleep or Anxiety  Dispense: 30 tablet; Refill: 1  - venlafaxine (EFFEXOR-XR) 37.5 MG 24 hr capsule; Take 1 capsule (37.5 mg) by mouth daily  Dispense: 90 capsule; Refill: 1    3. Moderately severe major depression  - buPROPion XL (WELLBUTRIN XL) 150 MG 24 hr tablet; Take 1 tablet (150 mg) by mouth daily  Dispense: 90 tablet; Refill: 1    4. Hyperhidrosis  - glycopyrrolate (ROBINUL) 2 MG tablet; Take 1 tablet (2 mg) by mouth 2 (two) times daily  Dispense: 180 tablet; Refill: 1          Plan:    Chronic issues discussed  Medications refilled for 6 months  Reduce venlafaxine to 37.5            Follow-up:    Return in about 6 months (around 03/15/2022) for Wellness Exam, Med check.         Suleman Gunning Sloan Leiter, MD

## 2021-10-24 IMAGING — CT CT ANGIO CHEST
3 of 9 series · 15 of 46 positions shown · IV contrast (APPLIED)
Comparison: None.

CLINICAL DATA: Gastric sleeve procedure 3 weeks ago.  Vomiting.

EXAM:
CT ANGIOGRAPHY CHEST
CT ABDOMEN AND PELVIS WITH CONTRAST
TECHNIQUE: Multidetector CT imaging of the chest was performed using the
standard protocol during bolus administration of intravenous
contrast. Multiplanar CT image reconstructions and MIPs were
obtained to evaluate the vascular anatomy. Multidetector CT imaging
of the abdomen and pelvis was performed using the standard protocol
during bolus administration of intravenous contrast.
CONTRAST:  100mL OMNIPAQUE IOHEXOL 350 MG/ML SOLN

[Series 505: arterial · axial · arterial · 0.81mm/px · z∈[+1096,+1276]mm · 4 of 151 slices shown]
[im 31/151  lung]
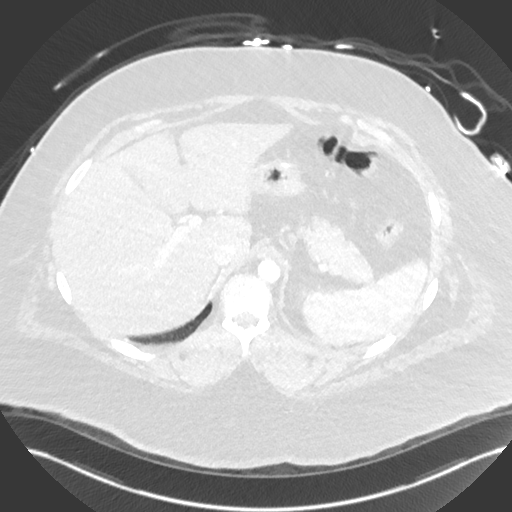
[im 61/151  soft-tissue]
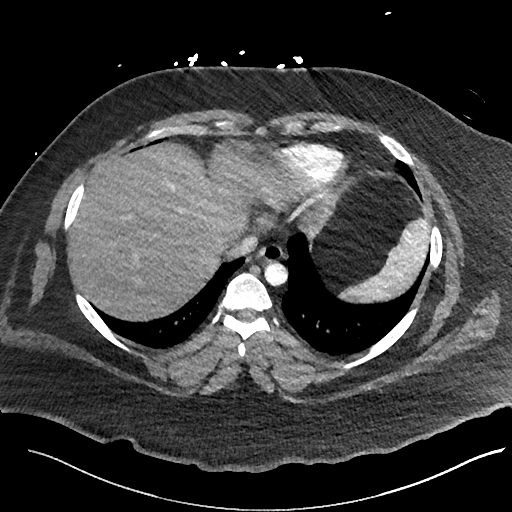
[im 91/151  lung]
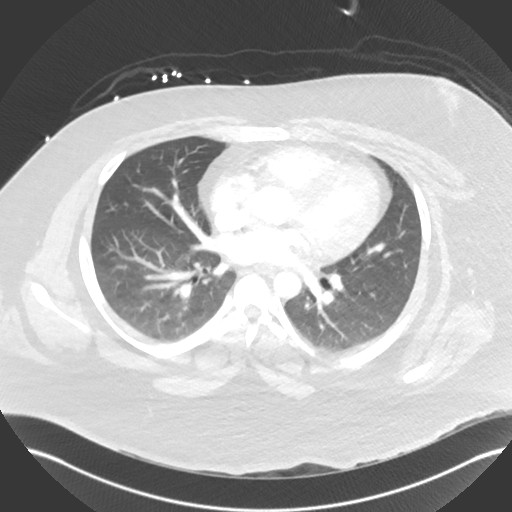
[im 121/151  soft-tissue]
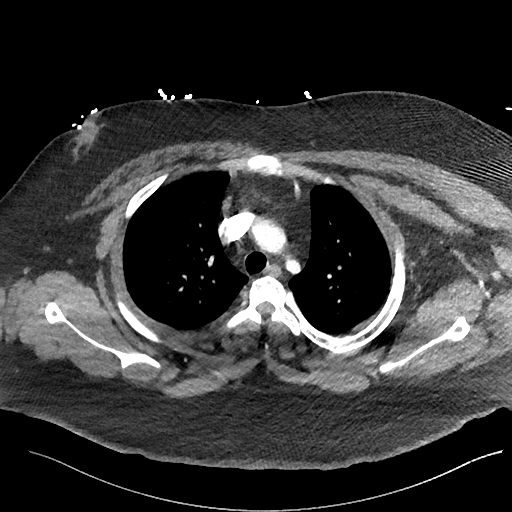

[Series 506: thins · axial · 0.81mm/px · z∈[+1071,+1317]mm · 8 of 429 slices shown]
[im 51/429  lung]
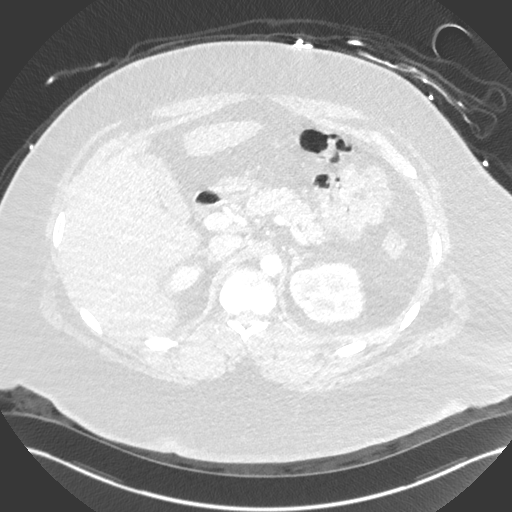
[im 101/429  lung]
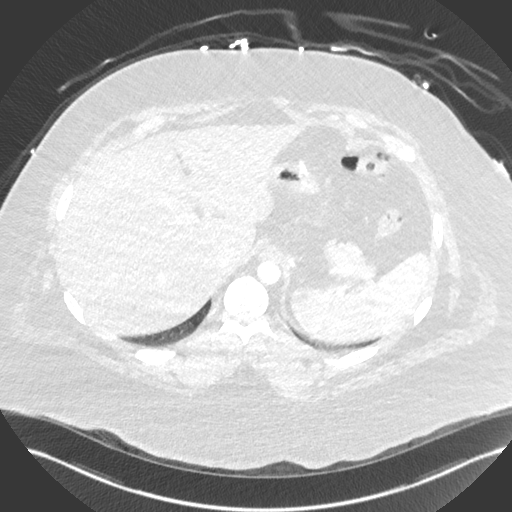
[im 152/429  lung]
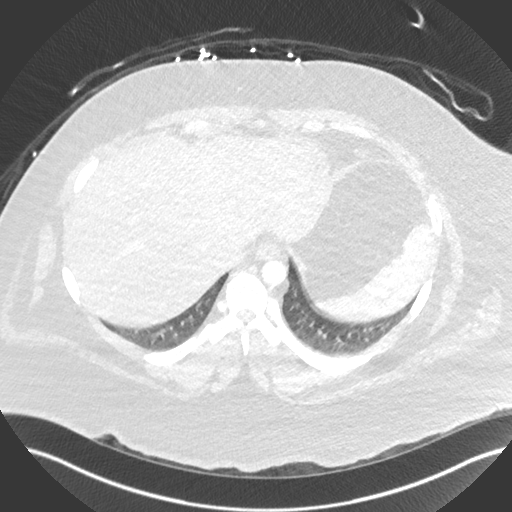
[im 202/429  lung]
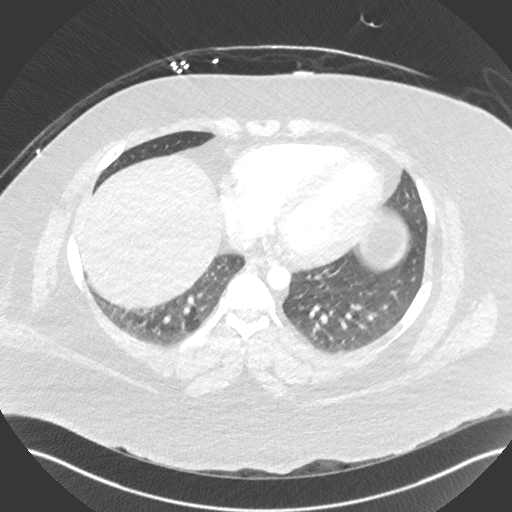
[im 252/429  lung]
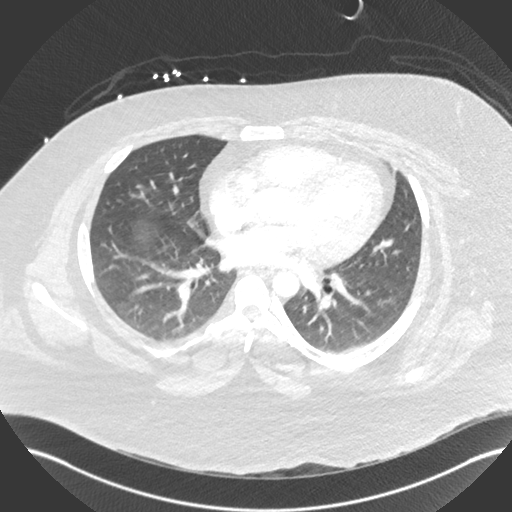
[im 303/429  lung]
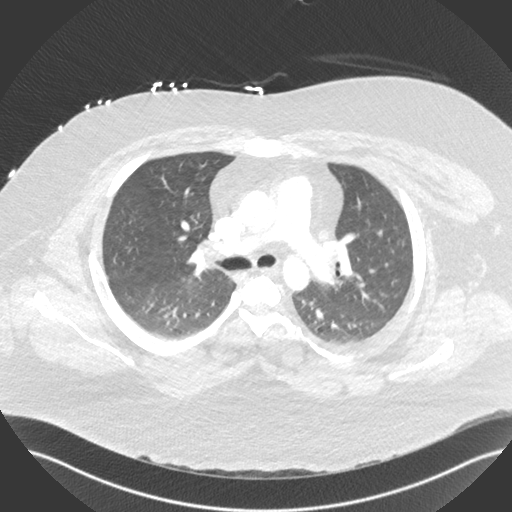
[im 353/429  lung]
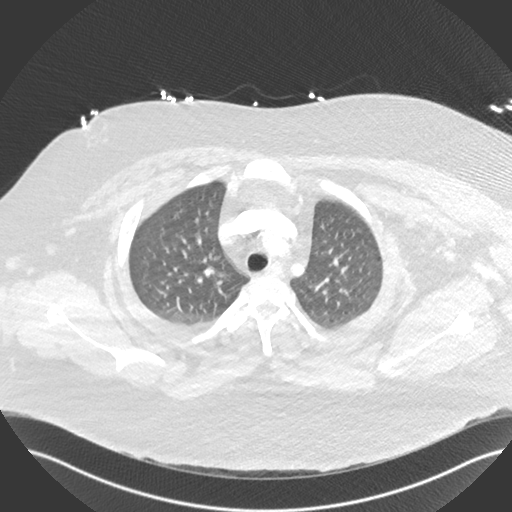
[im 403/429  lung]
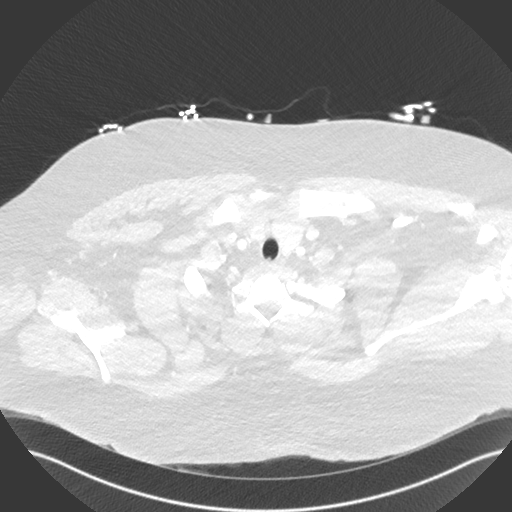

[Series 507: cor · coronal · 0.60mm/px · 3 of 178 slices shown]
[im 45/178  soft-tissue]
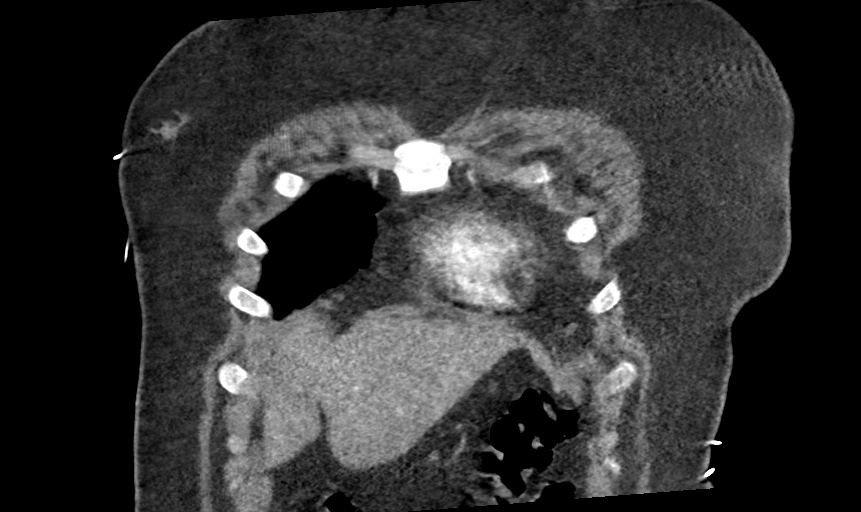
[im 89/178  soft-tissue]
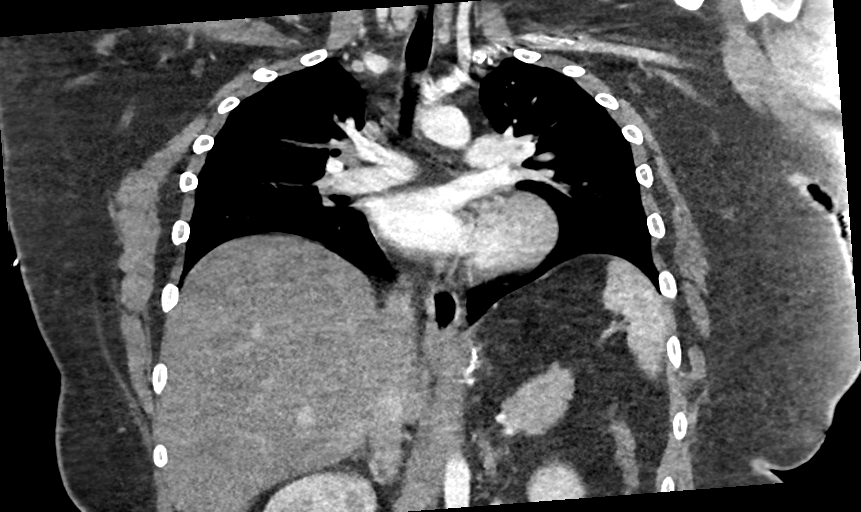
[im 133/178  soft-tissue]
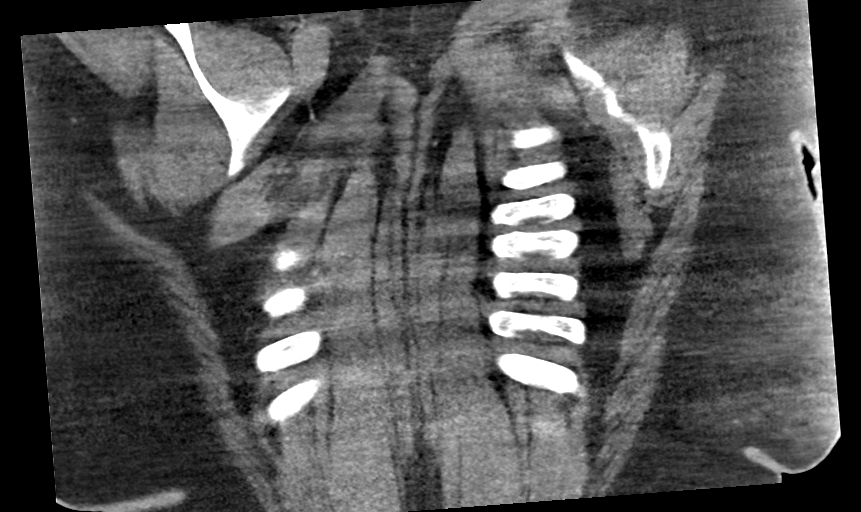

[15 of 46 positions shown; findings below may reference images not displayed]

FINDINGS: CTA CHEST FINDINGS

Cardiovascular: Contrast injection is sufficient to demonstrate
satisfactory opacification of the pulmonary arteries to the
segmental level. There is no pulmonary embolus. The main pulmonary
artery is within normal limits for size. There is no CT evidence of
acute right heart strain. The visualized aorta is normal. There is a
normal 3-vessel arch branching pattern. Heart size is normal,
without pericardial effusion.

Mediastinum/Nodes: No mediastinal, hilar or axillary
lymphadenopathy. The visualized thyroid and thoracic esophageal
course are unremarkable.

Lungs/Pleura: No pulmonary nodules or masses. No pleural effusion or
pneumothorax. No focal airspace consolidation. No focal pleural
abnormality.

Musculoskeletal: No chest wall abnormality. No acute or significant
osseous findings.

Review of the MIP images confirms the above findings.

CT ABDOMEN and PELVIS FINDINGS

Hepatobiliary: Normal hepatic contours and density. No visible
biliary dilatation. Normal gallbladder.

Pancreas: Normal contours without ductal dilatation. No
peripancreatic fluid collection.

Spleen: Normal.

Adrenals/Urinary Tract:

--Adrenal glands: Normal.

--Right kidney/ureter: No hydronephrosis or perinephric stranding.
No nephrolithiasis. No obstructing ureteral stones.

--Left kidney/ureter: No hydronephrosis or perinephric stranding. No
nephrolithiasis. No obstructing ureteral stones.

--Urinary bladder: Unremarkable.

Stomach/Bowel:

--Stomach/Duodenum: Recent gastric sleeve procedure. No abnormality.

--Small bowel: No dilatation or inflammation.

--Colon: No focal abnormality.

--Appendix: Normal.

Vascular/Lymphatic: Normal course and caliber of the major abdominal
vessels. No abdominal or pelvic lymphadenopathy.

Reproductive: Normal prostate and seminal vesicles.

Musculoskeletal. No bony spinal canal stenosis or focal osseous
abnormality.

Other: None.
IMPRESSION: 1. No pulmonary embolus or acute aortic syndrome.
2. Recent gastric sleeve procedure without abnormality.
3. No acute abnormality of the chest, abdomen or pelvis.

## 2021-11-12 ENCOUNTER — Other Ambulatory Visit (HOSPITAL_BASED_OUTPATIENT_CLINIC_OR_DEPARTMENT_OTHER): Payer: Self-pay | Admitting: Surgery

## 2021-11-12 DIAGNOSIS — K219 Gastro-esophageal reflux disease without esophagitis: Secondary | ICD-10-CM

## 2021-12-18 ENCOUNTER — Other Ambulatory Visit (HOSPITAL_BASED_OUTPATIENT_CLINIC_OR_DEPARTMENT_OTHER): Payer: Self-pay | Admitting: Surgery

## 2021-12-18 DIAGNOSIS — K219 Gastro-esophageal reflux disease without esophagitis: Secondary | ICD-10-CM

## 2022-01-17 ENCOUNTER — Other Ambulatory Visit (HOSPITAL_BASED_OUTPATIENT_CLINIC_OR_DEPARTMENT_OTHER): Payer: Self-pay | Admitting: Surgery

## 2022-01-17 DIAGNOSIS — K219 Gastro-esophageal reflux disease without esophagitis: Secondary | ICD-10-CM

## 2022-01-19 ENCOUNTER — Other Ambulatory Visit (INDEPENDENT_AMBULATORY_CARE_PROVIDER_SITE_OTHER): Payer: Self-pay | Admitting: Family Medicine

## 2022-01-19 DIAGNOSIS — F411 Generalized anxiety disorder: Secondary | ICD-10-CM

## 2022-01-21 ENCOUNTER — Telehealth: Payer: Self-pay | Admitting: Surgery

## 2022-01-21 NOTE — Telephone Encounter (Signed)
[facility] pharmacy is asking for a PA to be sent to the insurance for the medication (lansoprazole (PREVACID) 30 MG capsule)

## 2022-01-25 ENCOUNTER — Telehealth (HOSPITAL_BASED_OUTPATIENT_CLINIC_OR_DEPARTMENT_OTHER): Payer: Self-pay

## 2022-01-25 ENCOUNTER — Encounter (HOSPITAL_BASED_OUTPATIENT_CLINIC_OR_DEPARTMENT_OTHER): Payer: Self-pay

## 2022-01-25 NOTE — Telephone Encounter (Signed)
grover robinson Key: [id number] - PA Case ID: [id number] Need help? Call us at [phone]  Outcome  Approvedtoday  PA Case: [id number], Status: Approved, Coverage Starts on: 01/25/2022 12:00:00 AM, Coverage Ends on: 04/19/2022 12:00:00 AM.  Drug  Lansoprazole 30MG  dr capsules  Form  [employer] Electronic PA

## 2022-02-14 ENCOUNTER — Telehealth (INDEPENDENT_AMBULATORY_CARE_PROVIDER_SITE_OTHER): Payer: Self-pay | Admitting: Family Medicine

## 2022-02-14 DIAGNOSIS — F322 Major depressive disorder, single episode, severe without psychotic features: Secondary | ICD-10-CM

## 2022-02-14 MED ORDER — BUPROPION HCL ER (XL) 150 MG PO TB24
150.0000 mg | ORAL_TABLET | Freq: Every day | ORAL | 0 refills | Status: DC
Start: 2022-02-14 — End: 2022-03-23

## 2022-02-14 NOTE — Addendum Note (Signed)
Addended by: Arvilla Meres on: 02/14/2022 04:54 PM     Modules accepted: Orders

## 2022-02-14 NOTE — Telephone Encounter (Signed)
Rx sent to pharmacy   

## 2022-02-14 NOTE — Telephone Encounter (Signed)
Requesting medication refill    Prescription: buPROPion XL (WELLBUTRIN XL) 150 MG 24 hr tablet    Last Filled: 09/14/2021    Last Appointment: 09/14/2021 for CC    Due to Follow up: 03/15/2022    Pharmacy: [facility] [address] [place] - [address]

## 2022-02-15 NOTE — Telephone Encounter (Signed)
STP relayed message that refill was sent to pharmacy, confirmed the correct pharmacy. Advised to contact pharmacy to get refill of medication.

## 2022-03-13 ENCOUNTER — Other Ambulatory Visit (INDEPENDENT_AMBULATORY_CARE_PROVIDER_SITE_OTHER): Payer: Self-pay | Admitting: Family Medicine

## 2022-03-13 DIAGNOSIS — F411 Generalized anxiety disorder: Secondary | ICD-10-CM

## 2022-03-22 ENCOUNTER — Other Ambulatory Visit (INDEPENDENT_AMBULATORY_CARE_PROVIDER_SITE_OTHER): Payer: Self-pay | Admitting: Family Medicine

## 2022-03-22 ENCOUNTER — Emergency Department
Admission: EM | Admit: 2022-03-22 | Discharge: 2022-03-22 | Disposition: A | Discharge status: Home / Self Care | Payer: Medicaid Other | Attending: Emergency Medicine | Admitting: Emergency Medicine

## 2022-03-22 DIAGNOSIS — R112 Nausea with vomiting, unspecified: Secondary | ICD-10-CM | POA: Insufficient documentation

## 2022-03-22 DIAGNOSIS — Z760 Encounter for issue of repeat prescription: Secondary | ICD-10-CM | POA: Insufficient documentation

## 2022-03-22 DIAGNOSIS — I10 Essential (primary) hypertension: Secondary | ICD-10-CM

## 2022-03-22 DIAGNOSIS — F411 Generalized anxiety disorder: Secondary | ICD-10-CM

## 2022-03-22 LAB — CBC AND DIFFERENTIAL
Absolute NRBC: 0 10*3/uL (ref 0.00–0.00)
Basophils Absolute Automated: 0.06 10*3/uL (ref 0.00–0.08)
Basophils Automated: 1 %
Eosinophils Absolute Automated: 0.44 10*3/uL (ref 0.00–0.44)
Eosinophils Automated: 7.3 %
Hematocrit: 42.5 % (ref 37.6–49.6)
Hgb: 13.4 g/dL (ref 12.5–17.1)
Immature Granulocytes Absolute: 0.01 10*3/uL (ref 0.00–0.07)
Immature Granulocytes: 0.2 %
Instrument Absolute Neutrophil Count: 3.17 10*3/uL (ref 1.10–6.33)
Lymphocytes Absolute Automated: 2.01 10*3/uL (ref 0.42–3.22)
Lymphocytes Automated: 33.2 %
MCH: 25.9 pg (ref 25.1–33.5)
MCHC: 31.5 g/dL (ref 31.5–35.8)
MCV: 82 fL (ref 78.0–96.0)
MPV: 11.3 fL (ref 8.9–12.5)
Monocytes Absolute Automated: 0.36 10*3/uL (ref 0.21–0.85)
Monocytes: 6 %
Neutrophils Absolute: 3.17 10*3/uL (ref 1.10–6.33)
Neutrophils: 52.3 %
Nucleated RBC: 0 /100 WBC (ref 0.0–0.0)
Platelets: 283 10*3/uL (ref 142–346)
RBC: 5.18 10*6/uL (ref 4.20–5.90)
RDW: 15 % (ref 11–15)
WBC: 6.05 10*3/uL (ref 3.10–9.50)

## 2022-03-22 LAB — COMPREHENSIVE METABOLIC PANEL
ALT: 14 U/L (ref 0–55)
AST (SGOT): 18 U/L (ref 5–41)
Albumin/Globulin Ratio: 1 (ref 0.9–2.2)
Albumin: 3.7 g/dL (ref 3.5–5.0)
Alkaline Phosphatase: 105 U/L (ref 65–260)
Anion Gap: 9 (ref 5.0–15.0)
BUN: 8 mg/dL — ABNORMAL LOW (ref 9.0–28.0)
Bilirubin, Total: 0.4 mg/dL (ref 0.2–1.2)
CO2: 24 mEq/L (ref 17–29)
Calcium: 9.7 mg/dL (ref 8.5–10.5)
Chloride: 107 mEq/L (ref 99–111)
Creatinine: 0.8 mg/dL (ref 0.5–1.5)
Globulin: 3.7 g/dL — ABNORMAL HIGH (ref 2.0–3.6)
Glucose: 90 mg/dL (ref 70–100)
Potassium: 4.5 mEq/L (ref 3.5–5.3)
Protein, Total: 7.4 g/dL (ref 6.0–8.3)
Sodium: 140 mEq/L (ref 135–145)
eGFR: >60 mL/min/{1.73_m2} (ref >=60)

## 2022-03-22 LAB — LIPASE: Lipase: 19 U/L (ref 8–78)

## 2022-03-22 MED ORDER — FAMOTIDINE 10 MG/ML IV SOLN (WRAP)
20.0000 mg | Freq: Once | INTRAVENOUS | Status: AC
Start: 2022-03-22 — End: 2022-03-22
  Administered 2022-03-22: 20 mg via INTRAVENOUS
  Filled 2022-03-22: qty 2

## 2022-03-22 MED ORDER — ONDANSETRON 4 MG PO TBDP
4.0000 mg | ORAL_TABLET | Freq: Four times a day (QID) | ORAL | 0 refills | Status: DC | PRN
Start: 2022-03-22 — End: 2022-10-04

## 2022-03-22 MED ORDER — VENLAFAXINE HCL ER 37.5 MG PO CP24
37.5000 mg | ORAL_CAPSULE | Freq: Every day | ORAL | 0 refills | Status: DC
Start: 2022-03-22 — End: 2022-03-23

## 2022-03-22 MED ORDER — ONDANSETRON HCL 4 MG/2ML IJ SOLN
4.0000 mg | Freq: Once | INTRAMUSCULAR | Status: AC
Start: 2022-03-22 — End: 2022-03-22
  Administered 2022-03-22: 4 mg via INTRAVENOUS
  Filled 2022-03-22: qty 2

## 2022-03-22 MED ORDER — KETOROLAC TROMETHAMINE 30 MG/ML IJ SOLN
30.0000 mg | Freq: Once | INTRAMUSCULAR | Status: AC
Start: 2022-03-22 — End: 2022-03-22
  Administered 2022-03-22: 30 mg via INTRAVENOUS
  Filled 2022-03-22: qty 1

## 2022-03-22 NOTE — Telephone Encounter (Signed)
Patient stopped in the office a little after 1:30 pm today requesting a refill on his venlafaxine (EFFEXOR-XR) 37.5 MG 24 hr capsule to go to his [employer] on file.

## 2022-03-22 NOTE — Telephone Encounter (Signed)
Patient called back and asked for reason the refill was denied, relayed message that patient is overdue for CC f/up with Dr. Attempted to schedule patient for an opening on 6/28. Patient stated he has been out of medication for 3 days when stated that must be seen prior to refill patient hung up phone.

## 2022-03-22 NOTE — Telephone Encounter (Signed)
We denied his medication because he is due for CC .

## 2022-03-23 ENCOUNTER — Encounter (INDEPENDENT_AMBULATORY_CARE_PROVIDER_SITE_OTHER): Payer: Self-pay | Admitting: Family Medicine

## 2022-03-23 ENCOUNTER — Ambulatory Visit (INDEPENDENT_AMBULATORY_CARE_PROVIDER_SITE_OTHER): Payer: Medicaid Other | Admitting: Family Medicine

## 2022-03-23 VITALS — BP 120/70 | HR 72 | Temp 98.4°F | Ht 70.0 in | Wt 297.4 lb

## 2022-03-23 DIAGNOSIS — Z13220 Encounter for screening for lipoid disorders: Secondary | ICD-10-CM

## 2022-03-23 DIAGNOSIS — F411 Generalized anxiety disorder: Secondary | ICD-10-CM

## 2022-03-23 DIAGNOSIS — I10 Essential (primary) hypertension: Secondary | ICD-10-CM

## 2022-03-23 DIAGNOSIS — F322 Major depressive disorder, single episode, severe without psychotic features: Secondary | ICD-10-CM

## 2022-03-23 DIAGNOSIS — G4700 Insomnia, unspecified: Secondary | ICD-10-CM

## 2022-03-23 DIAGNOSIS — Z0000 Encounter for general adult medical examination without abnormal findings: Secondary | ICD-10-CM

## 2022-03-23 DIAGNOSIS — Z1159 Encounter for screening for other viral diseases: Secondary | ICD-10-CM

## 2022-03-23 DIAGNOSIS — J300 Vasomotor rhinitis: Secondary | ICD-10-CM

## 2022-03-23 DIAGNOSIS — R61 Generalized hyperhidrosis: Secondary | ICD-10-CM

## 2022-03-23 DIAGNOSIS — F902 Attention-deficit hyperactivity disorder, combined type: Secondary | ICD-10-CM

## 2022-03-23 DIAGNOSIS — Z131 Encounter for screening for diabetes mellitus: Secondary | ICD-10-CM

## 2022-03-23 MED ORDER — LISINOPRIL 20 MG PO TABS
20.0000 mg | ORAL_TABLET | Freq: Every day | ORAL | 1 refills | Status: DC
Start: 2022-03-23 — End: 2022-05-19

## 2022-03-23 MED ORDER — BUPROPION HCL ER (XL) 150 MG PO TB24
150.0000 mg | ORAL_TABLET | Freq: Every day | ORAL | 1 refills | Status: DC
Start: 2022-03-23 — End: 2022-05-19

## 2022-03-23 MED ORDER — VENLAFAXINE HCL ER 37.5 MG PO CP24
37.5000 mg | ORAL_CAPSULE | Freq: Every day | ORAL | 1 refills | Status: DC
Start: 2022-03-23 — End: 2022-05-19

## 2022-03-23 MED ORDER — ALPRAZOLAM 0.5 MG PO TABS
0.5000 mg | ORAL_TABLET | Freq: Three times a day (TID) | ORAL | 2 refills | Status: DC | PRN
Start: 2022-03-23 — End: 2022-05-19

## 2022-03-23 MED ORDER — AMPHETAMINE-DEXTROAMPHET ER 20 MG PO CP24
20.0000 mg | ORAL_CAPSULE | Freq: Every day | ORAL | 0 refills | Status: DC
Start: 2022-03-23 — End: 2022-03-31

## 2022-03-23 MED ORDER — GLYCOPYRROLATE 2 MG PO TABS
2.0000 mg | ORAL_TABLET | Freq: Two times a day (BID) | ORAL | 1 refills | Status: DC
Start: 2022-03-23 — End: 2022-05-19

## 2022-03-23 NOTE — Progress Notes (Signed)
[facility] [place] [facility]                       Date of Exam: 03/23/2022 3:26 PM        Patient ID: Jason Ponce is a 21 y.o. male.  Attending Physician: Arvilla Meres, MD        Chief Complaint:    Chief Complaint   Patient presents with    Annual Exam     Pt had ice coffee prior to appt    Chronic Care Management - med refill               HPI:    Patient presents for an annual wellness exam.    Tobacco Use: no  Alcohol Use: no  Diet: fair, eating more on the go but keeping within his recommended caloric limits  Exercise: regular, 3x per week  Sexual Activity:    Family History of prostate cancer: no  Family History of colon cancer: no  Family history of early CAD: no    Other issues:  # HTN:  -On lisinopril 20 but ran out 2 days ago  -Home BPs: 120/70     # Anxiety  -On venlafaxine 37.5, bupropion XL 150, and PRN alprazolam  -Pt feels he'd like to decrease his venlafaxine     # ADHD:  -Currently off of medications but wanting to try it again    # Intermittent insomnia  -mainly with sleep onset  -benadryl and doxylamine help but cause hangover sedation    # Chronic nasal congestion  -Mainly when laying down  -Resolves when upright or exercising     # Hyperhidrosis   -On glycopyrrolate at his last visit and it has been helpful  -chronic issue that also runs in the family  -Mainly on the face and hands  -pt has tried dietary changes, topical antiperspirants, looser fitting clothes                   Problem List:    Patient Active Problem List   Diagnosis    Attention deficit hyperactivity disorder, combined type    Generalized anxiety disorder    Migraine    Moderately severe major depression    Morbid obesity    Gastroesophageal reflux disease without esophagitis    Benign essential hypertension    H/O gastric sleeve    BMI 45.0-49.9, adult    Hyperhidrosis    Insomnia, unspecified type    Vasomotor rhinitis             Current Meds:    Outpatient Medications Marked as  Taking for the 03/23/22 encounter (Office Visit) with Arvilla Meres, MD   Medication Sig Dispense Refill    lansoprazole (PREVACID) 30 MG capsule TAKE ONE CAPSULE BY MOUTH ONCE A DAY 30 capsule 0    Multiple Vitamin (multivitamin) tablet Take 1 tablet by mouth daily      ondansetron (ZOFRAN-ODT) 4 MG disintegrating tablet Take 1 tablet (4 mg) by mouth every 6 (six) hours as needed for Nausea 8 tablet 0    [DISCONTINUED] buPROPion XL (WELLBUTRIN XL) 150 MG 24 hr tablet Take 1 tablet (150 mg) by mouth daily 60 tablet 0    [DISCONTINUED] glycopyrrolate (ROBINUL) 2 MG tablet Take 1 tablet (2 mg) by mouth 2 (two) times daily 180 tablet 1    [DISCONTINUED] venlafaxine (EFFEXOR-XR) 37.5 MG 24 hr capsule Take 1 capsule (37.5 mg)  by mouth daily 90 capsule 1          Allergies:    No Known Allergies          Past Surgical History:    Past Surgical History:   Procedure Laterality Date    EGD, BIOPSY N/A 09/16/2020    Procedure: EGD, BIOPSY;  Surgeon: Tommye Standard, MD;  Location: [facility];  Service: General;  Laterality: N/A;    LAPAROSCOPIC, GASTRECTOMY SLEEVE N/A 01/04/2021    Procedure: LAPAROSCOPIC, GASTRECTOMY SLEEVE and Omentopexy;  Surgeon: Delorse Lek, MD;  Location: [facility];  Service: General;  Laterality: N/A;           Family History:    Family History   Problem Relation Age of Onset    Obesity Other     Diabetes Other     Esophageal cancer Neg Hx     Inflammatory bowel disease Neg Hx     Stomach cancer Neg Hx            Social History:    Social History     Tobacco Use    Smoking status: Never    Smokeless tobacco: Never   Vaping Use    Vaping Use: Never used   Substance Use Topics    Alcohol use: Never    Drug use: Yes     Types: Benzodiazepines     Comment: medical marijuana card 4 times a week as needed           The following sections were reviewed this encounter by the provider:   Tobacco  Allergies  Meds  Problems  Med Hx  Surg Hx  Fam Hx             Vital Signs:    BP 120/70   Pulse  72   Temp 98.4 F (36.9 C) (Temporal)   Ht 1.778 m (5\' 10" )   Wt 134.9 kg (297 lb 6.4 oz)   BMI 42.67 kg/m          ROS:    Review of Systems   Constitutional:  Negative for chills, fatigue, fever and unexpected weight change.   HENT:  Negative for congestion, dental problem, hearing loss, rhinorrhea and sore throat.    Eyes:  Negative for photophobia and visual disturbance.   Respiratory:  Negative for cough and shortness of breath.    Cardiovascular:  Negative for chest pain and palpitations.   Gastrointestinal:  Negative for abdominal pain, blood in stool, constipation, diarrhea and vomiting.   Genitourinary:  Negative for difficulty urinating, dysuria, frequency, penile discharge and testicular pain.   Musculoskeletal:  Negative for arthralgias and myalgias.   Skin:  Negative for rash.   Neurological:  Negative for weakness and numbness.   Psychiatric/Behavioral:  Positive for sleep disturbance. Negative for dysphoric mood and suicidal ideas. The patient is not nervous/anxious.               Physical Exam:    Physical Exam  Constitutional:       General: He is not in acute distress.     Appearance: Normal appearance. He is obese. He is not ill-appearing.   HENT:      Head: Normocephalic and atraumatic.      Right Ear: Tympanic membrane and ear canal normal.      Left Ear: Tympanic membrane and ear canal normal.      Nose: Nose normal.      Mouth/Throat:  Mouth: Mucous membranes are moist.      Pharynx: Oropharynx is clear.   Eyes:      Extraocular Movements: Extraocular movements intact.      Conjunctiva/sclera: Conjunctivae normal.      Pupils: Pupils are equal, round, and reactive to light.   Cardiovascular:      Rate and Rhythm: Normal rate and regular rhythm.      Heart sounds: No murmur heard.     No friction rub. No gallop.   Pulmonary:      Effort: Pulmonary effort is normal.      Breath sounds: Normal breath sounds. No wheezing, rhonchi or rales.   Abdominal:      General: Bowel sounds are  normal. There is no distension.      Palpations: Abdomen is soft. There is no mass.      Tenderness: There is no abdominal tenderness.   Musculoskeletal:      Cervical back: Normal range of motion and neck supple.      Right lower leg: No edema.      Left lower leg: No edema.   Skin:     General: Skin is warm and dry.   Neurological:      General: No focal deficit present.      Mental Status: He is alert.   Psychiatric:         Mood and Affect: Mood normal.         Behavior: Behavior normal.              Assessment:    1. Well adult exam    2. Screening for diabetes mellitus  - Comprehensive metabolic panel  - Hemoglobin A1C    3. Screening for lipid disorders  - Lipid panel    4. Screening for viral disease  - Hepatitis C (HCV) antibody, Total    5. Benign essential hypertension  - lisinopril (ZESTRIL) 20 MG tablet; Take 1 tablet (20 mg) by mouth daily  Dispense: 90 tablet; Refill: 1    6. Generalized anxiety disorder  - ALPRAZolam (XANAX) 0.5 MG tablet; Take 1 tablet (0.5 mg) by mouth 3 (three) times daily as needed for Sleep or Anxiety  Dispense: 30 tablet; Refill: 2  - venlafaxine (EFFEXOR-XR) 37.5 MG 24 hr capsule; Take 1 capsule (37.5 mg) by mouth daily  Dispense: 90 capsule; Refill: 1    7. Hyperhidrosis  - glycopyrrolate (ROBINUL) 2 MG tablet; Take 1 tablet (2 mg) by mouth 2 (two) times daily  Dispense: 180 tablet; Refill: 1    8. Moderately severe major depression  - buPROPion XL (WELLBUTRIN XL) 150 MG 24 hr tablet; Take 1 tablet (150 mg) by mouth daily  Dispense: 180 tablet; Refill: 1    9. Attention deficit hyperactivity disorder, combined type  - amphetamine-dextroamphetamine (Adderall XR) 20 MG 24 hr capsule; Take 1 capsule (20 mg) by mouth daily  Dispense: 30 capsule; Refill: 0    10. Morbid obesity    11. Insomnia, unspecified type    12. Vasomotor rhinitis            Plan:    Health maintenance items reviewed  Encouraged consumption of 4-5 servings of fruits/vegetables daily  Encouraged at least 150  minutes of moderate-intensity exercise per week  Labs as below  Immunizations: advised on COVID  Prostate Cancer Screening: not indicated yet  Colon Cancer Screening: not indicated yet  Other items discussed:  Chronic meds refilled  Repeat trial of adderall  Start  flonase for supine nasal congestion  May use xanax for insomnia              Follow-up:    Return in about 4 weeks (around 04/20/2022) for Med check.         Aleksander Edmiston Sloan Leiter, MD

## 2022-03-24 ENCOUNTER — Telehealth (INDEPENDENT_AMBULATORY_CARE_PROVIDER_SITE_OTHER): Payer: Self-pay | Admitting: Family Medicine

## 2022-03-24 ENCOUNTER — Encounter (INDEPENDENT_AMBULATORY_CARE_PROVIDER_SITE_OTHER): Payer: Self-pay | Admitting: Family Medicine

## 2022-03-24 DIAGNOSIS — F902 Attention-deficit hyperactivity disorder, combined type: Secondary | ICD-10-CM

## 2022-03-24 NOTE — Telephone Encounter (Signed)
[facility] pharmacy initiated a PA for Brand Name Adderall XR 20mg  via [email or web address]     I called [facility] pharmacy and spoke with Raynelle Fanning. I told her that PCP sent in generic Adderall XR 20mg . Per Raynelle Fanning, insurance will only cover BRAND Adderall XR 20mg  and a PA is needed.     The PA has been processed for Brand Adderall XR 20mg  via [email or web address] a random Urine screening is being requested to be considered for approval     I submitted the PA but answered "No" to the :    Has the prescriber ordered and reviewed a random urine drug screen at least every six months?   Yes   No    Otherwise, pt can pay out of pocket for generic Adderall XR 20mg  via goodrx [facility] cost $57.99  Lowest pharmacy offering medication is [facility] at $23.60     Pls advise

## 2022-03-24 NOTE — Addendum Note (Signed)
Addended by: Lorenza Evangelist SOO on: 03/24/2022 12:35 PM     Modules accepted: Orders

## 2022-03-24 NOTE — Telephone Encounter (Signed)
I tried to call patient - he did not answer. I left him a message to call me back

## 2022-03-24 NOTE — Telephone Encounter (Signed)
I'll put a urine drug screen order in so he can do this unless he's ok with the out of pocket goodrx price

## 2022-03-29 NOTE — ED Provider Notes (Signed)
EMERGENCY DEPARTMENT HISTORY AND PHYSICAL EXAM     None        Date: 03/22/2022  Patient Name: Jason Ponce    History of Presenting Illness and Plan     Chief Complaint   Patient presents with    Emesis    Fatigue       History Provided By: Patient    HPI:     HPI documented within ED course below.       Associated symptoms and pertinent negative listed in ROS.    PCP: Arvilla Meres, MD  SPECIALISTS:    No current facility-administered medications for this encounter.     Current Outpatient Medications   Medication Sig Dispense Refill    ALPRAZolam (XANAX) 0.5 MG tablet Take 1 tablet (0.5 mg) by mouth 3 (three) times daily as needed for Sleep or Anxiety 30 tablet 2    amphetamine-dextroamphetamine (Adderall XR) 20 MG 24 hr capsule Take 1 capsule (20 mg) by mouth daily 30 capsule 0    buPROPion XL (WELLBUTRIN XL) 150 MG 24 hr tablet Take 1 tablet (150 mg) by mouth daily 180 tablet 1    Ferrous Sulfate (IRON PO) Take by mouth daily (Patient not taking: Reported on 03/23/2022)      glycopyrrolate (ROBINUL) 2 MG tablet Take 1 tablet (2 mg) by mouth 2 (two) times daily 180 tablet 1    lansoprazole (PREVACID) 30 MG capsule TAKE ONE CAPSULE BY MOUTH ONCE A DAY 30 capsule 0    lisinopril (ZESTRIL) 20 MG tablet Take 1 tablet (20 mg) by mouth daily 90 tablet 1    Multiple Vitamin (multivitamin) tablet Take 1 tablet by mouth daily      ondansetron (ZOFRAN-ODT) 4 MG disintegrating tablet Take 1 tablet (4 mg) by mouth every 6 (six) hours as needed for Nausea 8 tablet 0    venlafaxine (EFFEXOR-XR) 37.5 MG 24 hr capsule Take 1 capsule (37.5 mg) by mouth daily 90 capsule 1       Past History     Past Medical History:  Past Medical History:   Diagnosis Date    Anxiety     Attention deficit hyperactivity disorder (ADHD)     Claustrophobia     COVID-19 vaccine series completed     completed 2 doses Pfizer vaccine    Depression     Dyspnea on exertion 2014    Gastritis     EGD 2021    Gastroesophageal reflux disease      occasional    Hypertension 2019    stable on meds    Migraine 2010    Morbid obesity     BMI 50.6        Past Surgical History:  Past Surgical History:   Procedure Laterality Date    EGD, BIOPSY N/A 09/16/2020    Procedure: EGD, BIOPSY;  Surgeon: Tommye Standard, MD;  Location: [facility];  Service: General;  Laterality: N/A;    LAPAROSCOPIC, GASTRECTOMY SLEEVE N/A 01/04/2021    Procedure: LAPAROSCOPIC, GASTRECTOMY SLEEVE and Omentopexy;  Surgeon: Delorse Lek, MD;  Location: [facility];  Service: General;  Laterality: N/A;       Family History:  Family History   Problem Relation Age of Onset    Obesity Other     Diabetes Other     Esophageal cancer Neg Hx     Inflammatory bowel disease Neg Hx     Stomach cancer Neg Hx  Social History:  Social History     Tobacco Use    Smoking status: Never    Smokeless tobacco: Never   Vaping Use    Vaping Use: Never used   Substance Use Topics    Alcohol use: Never    Drug use: Yes     Types: Benzodiazepines     Comment: medical marijuana card 4 times a week as needed       Allergies:  No Known Allergies    Review of Systems     Review of Systems   Constitutional:  Positive for fatigue. Negative for chills and fever.   HENT:  Negative for congestion.    Eyes:  Negative for visual disturbance.   Respiratory:  Negative for cough.    Cardiovascular:  Negative for chest pain.   Gastrointestinal:  Positive for nausea and vomiting. Negative for abdominal pain.   Genitourinary:  Negative for dysuria.   Musculoskeletal:  Negative for myalgias.   Neurological:  Positive for headaches. Negative for dizziness and numbness.   Psychiatric/Behavioral:  Negative for confusion.        Physical Exam   BP 126/58   Pulse 80   Temp 98.4 F (36.9 C) (Oral)   Resp 16   Ht 5\' 10"  (1.778 m)   Wt 133.5 kg   SpO2 99%   BMI 42.24 kg/m     Physical Exam  Vitals and nursing note reviewed.   Constitutional:       General: He is in acute distress.      Appearance: He is  well-developed. He is obese.   HENT:      Head: Normocephalic and atraumatic.   Eyes:      Pupils: Pupils are equal, round, and reactive to light.   Cardiovascular:      Rate and Rhythm: Normal rate and regular rhythm.      Heart sounds: Normal heart sounds.   Pulmonary:      Effort: Pulmonary effort is normal.      Breath sounds: Normal breath sounds. No wheezing.   Abdominal:      Palpations: Abdomen is soft.      Tenderness: There is no abdominal tenderness. There is no guarding or rebound.   Musculoskeletal:         General: Normal range of motion.      Cervical back: Normal range of motion and neck supple.   Skin:     General: Skin is warm and dry.   Neurological:      Mental Status: He is alert and oriented to person, place, and time.   Psychiatric:         Behavior: Behavior normal.         Diagnostic Study Results     Labs -     Results       Procedure Component Value Units Date/Time    Comprehensive metabolic panel [[id number]]  (Abnormal) Collected: 03/22/22 1850    Specimen: Blood Updated: 03/22/22 1917     Glucose 90 mg/dL      BUN 8.0 mg/dL      Creatinine 0.8 mg/dL      Sodium 409 mEq/L      Potassium 4.5 mEq/L      Chloride 107 mEq/L      CO2 24 mEq/L      Calcium 9.7 mg/dL      Protein, Total 7.4 g/dL      Albumin 3.7 g/dL      AST (  SGOT) 18 U/L      ALT 14 U/L      Alkaline Phosphatase 105 U/L      Bilirubin, Total 0.4 mg/dL      Globulin 3.7 g/dL      Albumin/Globulin Ratio 1.0     Anion Gap 9.0     eGFR >60.0 mL/min/1.73 m2     Lipase [[id number]] Collected: 03/22/22 1850    Specimen: Blood Updated: 03/22/22 1917     Lipase 19 U/L     CBC and differential [[id number]] Collected: 03/22/22 1850    Specimen: Blood Updated: 03/22/22 1903     WBC 6.05 x10 3/uL      Hgb 13.4 g/dL      Hematocrit 69.6 %      Platelets 283 x10 3/uL      RBC 5.18 x10 6/uL      MCV 82.0 fL      MCH 25.9 pg      MCHC 31.5 g/dL      RDW 15 %      MPV 11.3 fL      Instrument Absolute Neutrophil Count 3.17 x10 3/uL       Neutrophils 52.3 %      Lymphocytes Automated 33.2 %      Monocytes 6.0 %      Eosinophils Automated 7.3 %      Basophils Automated 1.0 %      Immature Granulocytes 0.2 %      Nucleated RBC 0.0 /100 WBC      Neutrophils Absolute 3.17 x10 3/uL      Lymphocytes Absolute Automated 2.01 x10 3/uL      Monocytes Absolute Automated 0.36 x10 3/uL      Eosinophils Absolute Automated 0.44 x10 3/uL      Basophils Absolute Automated 0.06 x10 3/uL      Immature Granulocytes Absolute 0.01 x10 3/uL      Absolute NRBC 0.00 x10 3/uL             Radiologic Studies -   Radiology Results (24 Hour)       ** No results found for the last 24 hours. **        .    Medical Decision Making   I am the first provider for this patient.    I reviewed the vital signs, available nursing notes, past medical history, past surgical history, family history and social history.      Vital Signs-Reviewed the patient's vital signs.   No data found.    Pulse Oximetry Analysis - Normal     Cardiac Monitor: Interpreted by me. Rhythm:  Normal Sinus, Rate:  Normal, Ectopy:  None         Labs: All labs have been ordered, reviewed and interpreted by me.        Critical Care Time:    Procedures      ED Course/ Provider Note/ MDM: :   Initial differential diagnosis to include but not limited to: gastritis, enteritis, viral illness,appendicitis, medication reaction    ED Course as of 03/29/22 0035   Tue Mar 22, 2022   6055 HPI: 21 year old status post recently running out of Effexor.  Patient has missed his dose for 3 days.  Patient states that the first day he did not have any symptoms.  The second day he felt extremely lethargic and tired slept most of the day.  Today he woke up with nausea vomiting.  Patient feels dehydrated.  Patient  has mild headache.  No fever.  No chills.  Patient has some generalized abdominal discomfort from dry heaving.  No dysuria or hematuria.  No flank pain.  Patient has no history of abdominal surgery. [ST]   2217 DM/provider note.   Nontoxic-appearing 21 year old.  Abdomen is soft nondistended nontender.  Do not suspect acute appendicitis or acute surgical process.    Labs reviewed: No leukocytosis.  Normal CMP.  Normal lipase.  Patient feeling better after Zofran and Toradol. [ST]      ED Course User Index  [ST] Cherlyn Roberts, MD         Medical Decision Making  Amount and/or Complexity of Data Reviewed  Labs: ordered.    Risk  Prescription drug management.              Dr. Audley Hose is the primary emergency doctor of record.    Diagnosis     Clinical Impression:   1. Nausea and vomiting, unspecified vomiting type    2. Medication refill        Treatment Plan:   ED Disposition       ED Disposition   Discharge    Condition   --    Date/Time   Tue Mar 22, 2022 10:13 PM    Comment   Dequavius Rene Paci Crisanto discharge to home/self care.    Condition at disposition: Stable                   _______________________________      This note was generated by the Epic EMR system/ Dragon speech recognition and may contain inherent errors or omissions not intended by the user. Grammatical errors, random word insertions, deletions and pronoun errors  are occasional consequences of this technology due to software limitations. Not all errors are caught or corrected. If there are questions or concerns about the content of this note or information contained within the body of this dictation they should be addressed directly with the author for clarification. The use of the ED course in this note was to facilitate documentation. The time stamps of the HPI, ROS, physical exam,EKG interpretation within the ED course reflect the time these elements were documented in the chart, not the time they were physically completed.      _______________________________       Cherlyn Roberts, MD  03/29/22 [phone]

## 2022-03-30 ENCOUNTER — Other Ambulatory Visit (HOSPITAL_BASED_OUTPATIENT_CLINIC_OR_DEPARTMENT_OTHER): Payer: Self-pay | Admitting: Surgery

## 2022-03-30 DIAGNOSIS — K219 Gastro-esophageal reflux disease without esophagitis: Secondary | ICD-10-CM

## 2022-03-31 ENCOUNTER — Telehealth (INDEPENDENT_AMBULATORY_CARE_PROVIDER_SITE_OTHER): Payer: Self-pay | Admitting: Family Medicine

## 2022-03-31 DIAGNOSIS — F902 Attention-deficit hyperactivity disorder, combined type: Secondary | ICD-10-CM

## 2022-03-31 MED ORDER — AMPHETAMINE-DEXTROAMPHET ER 20 MG PO CP24
20.0000 mg | ORAL_CAPSULE | Freq: Every day | ORAL | 0 refills | Status: DC
Start: 2022-03-31 — End: 2022-05-19

## 2022-03-31 MED ORDER — LANSOPRAZOLE 30 MG PO CPDR
30.0000 mg | DELAYED_RELEASE_CAPSULE | Freq: Every day | ORAL | 0 refills | Status: DC
Start: 2022-03-31 — End: 2022-10-17

## 2022-03-31 NOTE — Telephone Encounter (Signed)
Pt has been notified.

## 2022-03-31 NOTE — Telephone Encounter (Signed)
He'll need to set up an appointment to discuss this further.

## 2022-03-31 NOTE — Telephone Encounter (Signed)
Pls let patient know that he will need to schedule an appt with Dr. Myrtice Lauth to further discuss his request for a betablocker for medical documentation purposes.     Thank you

## 2022-03-31 NOTE — Telephone Encounter (Signed)
At 4:45pm, pt left a message requesting a call back he had a question regarding his Lisinopril medication    I spoke with pt he wanted to know if Lisinopril can be replaced with a "betablocker" he heard that it can also help with anxiety.    Pt has been notified that his message will be forwarded to Dr. Myrtice Lauth and I will call him back tomorrow with a response. Pt verbalized understanding

## 2022-03-31 NOTE — Addendum Note (Signed)
Addended by: Lorenza Evangelist SOO on: 03/31/2022 04:18 PM     Modules accepted: Orders

## 2022-03-31 NOTE — Telephone Encounter (Signed)
Rx sent to pharmacy   

## 2022-03-31 NOTE — Telephone Encounter (Signed)
Pt returned my call from 03/24/2022 he opted to pay out of pocket using a goodrx coupon for [facility] pharmacy.     Can you resend generic Adderall XR 20mg  to [facility] in [place] , pls

## 2022-04-01 NOTE — Telephone Encounter (Signed)
Left pt a VM

## 2022-04-04 ENCOUNTER — Ambulatory Visit (INDEPENDENT_AMBULATORY_CARE_PROVIDER_SITE_OTHER): Payer: Medicaid Other | Admitting: Family Medicine

## 2022-04-04 NOTE — Progress Notes (Deleted)
[facility] [place] [facility]                       Date of Exam: 04/04/2022 12:09 PM        Patient ID: Jason Ponce is a 21 y.o. male.  Attending Physician: Arvilla Meres, MD        Chief Complaint:    No chief complaint on file.              HPI:    HPI          Problem List:    Patient Active Problem List   Diagnosis    Attention deficit hyperactivity disorder, combined type    Generalized anxiety disorder    Migraine    Moderately severe major depression    Morbid obesity    Gastroesophageal reflux disease without esophagitis    Benign essential hypertension    H/O gastric sleeve    BMI 45.0-49.9, adult    Hyperhidrosis    Insomnia, unspecified type    Vasomotor rhinitis             Current Meds:    No outpatient medications have been marked as taking for the 04/04/22 encounter (Appointment) with Arvilla Meres, MD.          Allergies:    No Known Allergies          Past Surgical History:    Past Surgical History:   Procedure Laterality Date    EGD, BIOPSY N/A 09/16/2020    Procedure: EGD, BIOPSY;  Surgeon: Tommye Standard, MD;  Location: [facility];  Service: General;  Laterality: N/A;    LAPAROSCOPIC, GASTRECTOMY SLEEVE N/A 01/04/2021    Procedure: LAPAROSCOPIC, GASTRECTOMY SLEEVE and Omentopexy;  Surgeon: Delorse Lek, MD;  Location: [facility];  Service: General;  Laterality: N/A;           Family History:    Family History   Problem Relation Age of Onset    Obesity Other     Diabetes Other     Esophageal cancer Neg Hx     Inflammatory bowel disease Neg Hx     Stomach cancer Neg Hx            Social History:    Social History     Tobacco Use    Smoking status: Never    Smokeless tobacco: Never   Vaping Use    Vaping Use: Never used   Substance Use Topics    Alcohol use: Never    Drug use: Yes     Types: Benzodiazepines     Comment: medical marijuana card 4 times a week as needed           The following sections were reviewed this encounter by the provider:             Vital Signs:    There were no vitals taken for this visit.         ROS:    Review of Systems           Physical Exam:    Physical Exam         Assessment:    There are no diagnoses linked to this encounter.          Plan:                Follow-up:    No follow-ups on  file.         Lucianne Lei, MD

## 2022-04-07 ENCOUNTER — Telehealth (INDEPENDENT_AMBULATORY_CARE_PROVIDER_SITE_OTHER): Payer: Medicaid Other | Admitting: Family Medicine

## 2022-04-07 ENCOUNTER — Encounter (INDEPENDENT_AMBULATORY_CARE_PROVIDER_SITE_OTHER): Payer: Self-pay | Admitting: Family Medicine

## 2022-04-07 VITALS — Ht 70.0 in

## 2022-04-07 DIAGNOSIS — I10 Essential (primary) hypertension: Secondary | ICD-10-CM

## 2022-04-07 DIAGNOSIS — F411 Generalized anxiety disorder: Secondary | ICD-10-CM

## 2022-04-07 MED ORDER — PROPRANOLOL HCL ER 60 MG PO CP24
60.0000 mg | ORAL_CAPSULE | Freq: Every day | ORAL | 0 refills | Status: DC
Start: 2022-04-07 — End: 2022-05-19

## 2022-04-07 NOTE — Progress Notes (Signed)
[facility] [place] [facility]                       Date of Virtual Visit: 04/07/2022 1:16 PM        Patient ID: Jason Ponce is a 21 y.o. male.  Attending Physician: Jason Meres, MD       Telemedicine Eligibility:    State Location:  [x]  [place]  []  [place]  []  [place] []  [place]  []  Other:    Physical Location:  [x]  Home  []         []        []          []  Other:    Patient Identity Verification:  [x]  State Issued ID  []  Insurance Eligibility Check  []  Other:    Physical Address Verification: (for 911)  [x]  Yes  []  No    Personal identity shared with patient:  [x]  Yes  []  No    Education on nature of video visit shared with patient:  [x]  Yes  []  No    Emergency plan agreed upon with patient:  [x]  Yes  []  No    If the patient had not had this virtual visit, what would they have done?  []         []         []        []          []  Other:    Visit terminated since not appropriate for virtual care:  [x]  N/A  []  Reason:         Chief Complaint:    Chief Complaint   Patient presents with    Discuss alt BP medication     Pt would like to discuss betablocker medication that can help with BP and Anxiety                 HPI:    21yo M with HTN, anxiety, ADHD, insomnia, hyperhidrosis here to discuss possibly starting a beta blocker to help with both HTN and the physical effects of his anxiety.  This would also help limit his use of xanax              Problem List:    Patient Active Problem List   Diagnosis    Attention deficit hyperactivity disorder, combined type    Generalized anxiety disorder    Migraine    Moderately severe major depression    Morbid obesity    Gastroesophageal reflux disease without esophagitis    Benign essential hypertension    H/O gastric sleeve    BMI 45.0-49.9, adult    Hyperhidrosis    Insomnia, unspecified type    Vasomotor rhinitis             Current Meds:    Outpatient Medications Marked as Taking for the 04/07/22 encounter (Telemedicine  Visit) with Jason Meres, MD   Medication Sig Dispense Refill    ALPRAZolam (XANAX) 0.5 MG tablet Take 1 tablet (0.5 mg) by mouth 3 (three) times daily as needed for Sleep or Anxiety 30 tablet 2    amphetamine-dextroamphetamine (Adderall XR) 20 MG 24 hr capsule Take 1 capsule (20 mg) by mouth daily 30 capsule 0    buPROPion XL (WELLBUTRIN XL) 150 MG 24 hr tablet Take 1 tablet (150 mg) by mouth daily 180 tablet 1    Ferrous Sulfate (IRON PO) Take by mouth daily  glycopyrrolate (ROBINUL) 2 MG tablet Take 1 tablet (2 mg) by mouth 2 (two) times daily 180 tablet 1    lansoprazole (PREVACID) 30 MG capsule Take 1 capsule (30 mg) by mouth daily 30 capsule 0    lisinopril (ZESTRIL) 20 MG tablet Take 1 tablet (20 mg) by mouth daily 90 tablet 1    Multiple Vitamin (multivitamin) tablet Take 1 tablet by mouth daily      ondansetron (ZOFRAN-ODT) 4 MG disintegrating tablet Take 1 tablet (4 mg) by mouth every 6 (six) hours as needed for Nausea 8 tablet 0    venlafaxine (EFFEXOR-XR) 37.5 MG 24 hr capsule Take 1 capsule (37.5 mg) by mouth daily 90 capsule 1          Allergies:    No Known Allergies          Past Surgical History:    Past Surgical History:   Procedure Laterality Date    EGD, BIOPSY N/A 09/16/2020    Procedure: EGD, BIOPSY;  Surgeon: Jason Standard, MD;  Location: [facility];  Service: General;  Laterality: N/A;    LAPAROSCOPIC, GASTRECTOMY SLEEVE N/A 01/04/2021    Procedure: LAPAROSCOPIC, GASTRECTOMY SLEEVE and Omentopexy;  Surgeon: Jason Lek, MD;  Location: [facility];  Service: General;  Laterality: N/A;           Family History:    Family History   Problem Relation Age of Onset    Obesity Other     Diabetes Other     Esophageal cancer Neg Hx     Inflammatory bowel disease Neg Hx     Stomach cancer Neg Hx            Social History:    Social History     Tobacco Use    Smoking status: Never    Smokeless tobacco: Never   Vaping Use    Vaping Use: Never used   Substance Use Topics    Alcohol use:  Never    Drug use: Yes     Types: Benzodiazepines     Comment: medical marijuana card 4 times a week as needed           The following sections were reviewed this encounter by the provider:   Tobacco  Allergies  Meds  Problems  Med Hx  Surg Hx  Fam Hx             Vital Signs:    Ht 1.778 m (5\' 10" )   BMI 42.67 kg/m          ROS:    Review of Systems   All other systems reviewed and are negative.             Physical Exam:    Physical Exam   GENERAL APPEARANCE: alert, in no acute distress, pleasant, well nourished.   HEAD: normal appearance  EYES: no discharge  EARS: normal hearing  NECK/THYROID: appearance -supple  PSYCH: appropriate affect, appropriate mood, normal speech, normal attention        Assessment:    1. Benign essential hypertension  - propranolol (INDERAL LA) 60 MG 24 hr capsule; Take 1 capsule (60 mg) by mouth daily  Dispense: 30 capsule; Refill: 0    2. Generalized anxiety disorder  - propranolol (INDERAL LA) 60 MG 24 hr capsule; Take 1 capsule (60 mg) by mouth daily  Dispense: 30 capsule; Refill: 0            Plan:  Add propranolol   Monitor for symptomatic hypotension            Follow-up:    Return in about 4 weeks (around 05/05/2022) for Med check.         Jason Boffa Sloan LeiterSoo Doriana Mazurkiewicz, MD

## 2022-04-13 ENCOUNTER — Telehealth (INDEPENDENT_AMBULATORY_CARE_PROVIDER_SITE_OTHER): Payer: Medicaid Other | Admitting: Family Medicine

## 2022-05-16 ENCOUNTER — Other Ambulatory Visit (HOSPITAL_BASED_OUTPATIENT_CLINIC_OR_DEPARTMENT_OTHER): Payer: Self-pay | Admitting: Family

## 2022-05-16 ENCOUNTER — Other Ambulatory Visit (INDEPENDENT_AMBULATORY_CARE_PROVIDER_SITE_OTHER): Payer: Self-pay | Admitting: Family Medicine

## 2022-05-16 DIAGNOSIS — K219 Gastro-esophageal reflux disease without esophagitis: Secondary | ICD-10-CM

## 2022-05-16 DIAGNOSIS — F411 Generalized anxiety disorder: Secondary | ICD-10-CM

## 2022-05-16 DIAGNOSIS — I10 Essential (primary) hypertension: Secondary | ICD-10-CM

## 2022-05-18 ENCOUNTER — Other Ambulatory Visit (INDEPENDENT_AMBULATORY_CARE_PROVIDER_SITE_OTHER): Payer: Self-pay | Admitting: Family Medicine

## 2022-05-18 DIAGNOSIS — F322 Major depressive disorder, single episode, severe without psychotic features: Secondary | ICD-10-CM

## 2022-05-18 DIAGNOSIS — F411 Generalized anxiety disorder: Secondary | ICD-10-CM

## 2022-05-19 ENCOUNTER — Encounter (INDEPENDENT_AMBULATORY_CARE_PROVIDER_SITE_OTHER): Payer: Self-pay | Admitting: Family Medicine

## 2022-05-19 ENCOUNTER — Ambulatory Visit (INDEPENDENT_AMBULATORY_CARE_PROVIDER_SITE_OTHER): Payer: Medicaid Other | Admitting: Family Medicine

## 2022-05-19 DIAGNOSIS — F411 Generalized anxiety disorder: Secondary | ICD-10-CM

## 2022-05-19 DIAGNOSIS — R61 Generalized hyperhidrosis: Secondary | ICD-10-CM

## 2022-05-19 DIAGNOSIS — F322 Major depressive disorder, single episode, severe without psychotic features: Secondary | ICD-10-CM

## 2022-05-19 DIAGNOSIS — F902 Attention-deficit hyperactivity disorder, combined type: Secondary | ICD-10-CM

## 2022-05-19 DIAGNOSIS — I10 Essential (primary) hypertension: Secondary | ICD-10-CM

## 2022-05-19 MED ORDER — BUPROPION HCL ER (XL) 150 MG PO TB24
150.0000 mg | ORAL_TABLET | Freq: Every day | ORAL | 1 refills | Status: DC
Start: 2022-05-19 — End: 2022-12-07

## 2022-05-19 MED ORDER — GLYCOPYRROLATE 2 MG PO TABS
2.0000 mg | ORAL_TABLET | Freq: Two times a day (BID) | ORAL | 1 refills | Status: DC
Start: 2022-05-19 — End: 2022-12-07

## 2022-05-19 MED ORDER — ALPRAZOLAM 0.5 MG PO TABS
0.5000 mg | ORAL_TABLET | Freq: Three times a day (TID) | ORAL | 2 refills | Status: DC | PRN
Start: 2022-05-19 — End: 2022-12-07

## 2022-05-19 MED ORDER — VENLAFAXINE HCL ER 37.5 MG PO CP24
37.5000 mg | ORAL_CAPSULE | Freq: Every day | ORAL | 1 refills | Status: DC
Start: 2022-05-19 — End: 2022-12-07

## 2022-05-19 MED ORDER — PROPRANOLOL HCL ER 60 MG PO CP24
60.0000 mg | ORAL_CAPSULE | Freq: Every day | ORAL | 1 refills | Status: DC
Start: 2022-05-19 — End: 2022-12-07

## 2022-05-19 MED ORDER — AMPHETAMINE-DEXTROAMPHET ER 20 MG PO CP24
20.0000 mg | ORAL_CAPSULE | Freq: Every day | ORAL | 0 refills | Status: DC
Start: 2022-05-19 — End: 2022-12-07

## 2022-05-19 MED ORDER — LISINOPRIL 20 MG PO TABS
20.0000 mg | ORAL_TABLET | Freq: Every day | ORAL | 1 refills | Status: DC
Start: 2022-05-19 — End: 2022-12-07

## 2022-05-19 NOTE — Progress Notes (Signed)
[facility] [place] [facility]                       Date of Exam: 05/19/2022 9:20 AM        Patient ID: Jason Ponce is a 21 y.o. male.  Attending Physician: Arvilla Meres, MD        Chief Complaint:    Chief Complaint   Patient presents with    Med Check     Pt needs a refill for all meds , except lisinopril and Adderall               HPI:    21yo M with HTN, morbid obesity s/p gastric bypass, anxiety, ADHD, and insomnia here for a 1 month med f/u.  At his last visit, we added propranolol to help with somatic effects of his anxiety.  It has been helpful to reduce the awareness of his heartbeat as well as SOB.  He denies any side effects              Problem List:    Patient Active Problem List   Diagnosis    Attention deficit hyperactivity disorder, combined type    Generalized anxiety disorder    Migraine    Moderately severe major depression    Morbid obesity    Gastroesophageal reflux disease without esophagitis    Benign essential hypertension    H/O gastric sleeve    BMI 40.0-44.9, adult    Hyperhidrosis    Insomnia, unspecified type    Vasomotor rhinitis             Current Meds:    Outpatient Medications Marked as Taking for the 05/19/22 encounter (Office Visit) with Arvilla Meres, MD   Medication Sig Dispense Refill    Ferrous Sulfate (IRON PO) Take by mouth daily      lansoprazole (PREVACID) 30 MG capsule Take 1 capsule (30 mg) by mouth daily 30 capsule 0    Multiple Vitamin (multivitamin) tablet Take 1 tablet by mouth daily      ondansetron (ZOFRAN-ODT) 4 MG disintegrating tablet Take 1 tablet (4 mg) by mouth every 6 (six) hours as needed for Nausea 8 tablet 0    [DISCONTINUED] ALPRAZolam (XANAX) 0.5 MG tablet Take 1 tablet (0.5 mg) by mouth 3 (three) times daily as needed for Sleep or Anxiety 30 tablet 2    [DISCONTINUED] amphetamine-dextroamphetamine (Adderall XR) 20 MG 24 hr capsule Take 1 capsule (20 mg) by mouth daily 30 capsule 0    [DISCONTINUED] buPROPion XL  (WELLBUTRIN XL) 150 MG 24 hr tablet Take 1 tablet (150 mg) by mouth daily 180 tablet 1    [DISCONTINUED] glycopyrrolate (ROBINUL) 2 MG tablet Take 1 tablet (2 mg) by mouth 2 (two) times daily 180 tablet 1    [DISCONTINUED] lisinopril (ZESTRIL) 20 MG tablet Take 1 tablet (20 mg) by mouth daily 90 tablet 1    [DISCONTINUED] propranolol (INDERAL LA) 60 MG 24 hr capsule Take 1 capsule (60 mg) by mouth daily 30 capsule 0    [DISCONTINUED] venlafaxine (EFFEXOR-XR) 37.5 MG 24 hr capsule Take 1 capsule (37.5 mg) by mouth daily 90 capsule 1          Allergies:    No Known Allergies          Past Surgical History:    Past Surgical History:   Procedure Laterality Date    EGD, BIOPSY N/A 09/16/2020  Procedure: EGD, BIOPSY;  Surgeon: Tommye Standard, MD;  Location: [facility];  Service: General;  Laterality: N/A;    LAPAROSCOPIC, GASTRECTOMY SLEEVE N/A 01/04/2021    Procedure: LAPAROSCOPIC, GASTRECTOMY SLEEVE and Omentopexy;  Surgeon: Delorse Lek, MD;  Location: [facility];  Service: General;  Laterality: N/A;           Family History:    Family History   Problem Relation Age of Onset    Obesity Other     Diabetes Other     Esophageal cancer Neg Hx     Inflammatory bowel disease Neg Hx     Stomach cancer Neg Hx            Social History:    Social History     Tobacco Use    Smoking status: Never    Smokeless tobacco: Never   Vaping Use    Vaping Use: Never used   Substance Use Topics    Alcohol use: Never    Drug use: Yes     Types: Benzodiazepines     Comment: medical marijuana card 4 times a week as needed           The following sections were reviewed this encounter by the provider:   Tobacco  Allergies  Meds  Problems  Med Hx  Surg Hx  Fam Hx             Vital Signs:    BP 126/76 (BP Site: Right arm, Patient Position: Sitting, Cuff Size: Large)   Pulse (!) 55   Temp (!) 96.4 F (35.8 C) (Tympanic)   Ht 1.803 m (5\' 11" )   Wt 131.1 kg (289 lb)   BMI 40.31 kg/m          ROS:    Review of Systems    Constitutional:  Negative for chills and fever.   Respiratory:  Negative for shortness of breath.    Cardiovascular:  Negative for chest pain and palpitations.   Gastrointestinal:  Negative for abdominal pain, diarrhea and vomiting.              Physical Exam:    Physical Exam  Constitutional:       Appearance: Normal appearance. He is obese.   HENT:      Head: Normocephalic and atraumatic.      Right Ear: External ear normal.      Left Ear: External ear normal.      Nose: Nose normal.      Mouth/Throat:      Mouth: Mucous membranes are moist.   Eyes:      Extraocular Movements: Extraocular movements intact.      Conjunctiva/sclera: Conjunctivae normal.   Pulmonary:      Effort: Pulmonary effort is normal. No respiratory distress.   Musculoskeletal:         General: Normal range of motion.      Cervical back: Normal range of motion.   Skin:     General: Skin is dry.   Neurological:      General: No focal deficit present.      Mental Status: He is alert.   Psychiatric:         Mood and Affect: Mood normal.         Behavior: Behavior normal.         Thought Content: Thought content normal.         Judgment: Judgment normal.  Assessment:    1. Benign essential hypertension  - propranolol (INDERAL LA) 60 MG 24 hr capsule; Take 1 capsule (60 mg) by mouth daily  Dispense: 90 capsule; Refill: 1  - lisinopril (ZESTRIL) 20 MG tablet; Take 1 tablet (20 mg) by mouth daily  Dispense: 90 tablet; Refill: 1    2. Generalized anxiety disorder  - propranolol (INDERAL LA) 60 MG 24 hr capsule; Take 1 capsule (60 mg) by mouth daily  Dispense: 90 capsule; Refill: 1  - venlafaxine (EFFEXOR-XR) 37.5 MG 24 hr capsule; Take 1 capsule (37.5 mg) by mouth daily  Dispense: 90 capsule; Refill: 1  - ALPRAZolam (XANAX) 0.5 MG tablet; Take 1 tablet (0.5 mg) by mouth 3 (three) times daily as needed for Sleep or Anxiety  Dispense: 30 tablet; Refill: 2    3. Attention deficit hyperactivity disorder, combined type  -  amphetamine-dextroamphetamine (Adderall XR) 20 MG 24 hr capsule; Take 1 capsule (20 mg) by mouth daily  Dispense: 30 capsule; Refill: 0    4. Moderately severe major depression  - buPROPion XL (WELLBUTRIN XL) 150 MG 24 hr tablet; Take 1 tablet (150 mg) by mouth daily  Dispense: 180 tablet; Refill: 1    5. Hyperhidrosis  - glycopyrrolate (ROBINUL) 2 MG tablet; Take 1 tablet (2 mg) by mouth 2 (two) times daily  Dispense: 180 tablet; Refill: 1            Plan:    Doing well on propranolol  Long term refill provided          Follow-up:    Return in about 6 months (around 11/19/2022) for Med check.         Ski Polich Sloan Leiter, MD

## 2022-06-10 ENCOUNTER — Ambulatory Visit (INDEPENDENT_AMBULATORY_CARE_PROVIDER_SITE_OTHER): Payer: Medicaid Other | Admitting: Family Medicine

## 2022-06-10 NOTE — Progress Notes (Deleted)
[facility] [place] [facility]                       Date of Exam: 06/10/2022 7:47 AM        Patient ID: Jason Ponce is a 21 y.o. male.  Attending Physician: Arvilla Meres, MD        Chief Complaint:    No chief complaint on file.              HPI:    21yo M with HTN, morbid obesity s/p gastric bypass, anxiety, ADHD, and insomnia               Problem List:    Patient Active Problem List   Diagnosis    Attention deficit hyperactivity disorder, combined type    Generalized anxiety disorder    Migraine    Moderately severe major depression    Morbid obesity    Gastroesophageal reflux disease without esophagitis    Benign essential hypertension    H/O gastric sleeve    BMI 40.0-44.9, adult    Hyperhidrosis    Insomnia, unspecified type    Vasomotor rhinitis             Current Meds:    No outpatient medications have been marked as taking for the 06/10/22 encounter (Appointment) with Arvilla Meres, MD.          Allergies:    No Known Allergies          Past Surgical History:    Past Surgical History:   Procedure Laterality Date    EGD, BIOPSY N/A 09/16/2020    Procedure: EGD, BIOPSY;  Surgeon: Tommye Standard, MD;  Location: [facility];  Service: General;  Laterality: N/A;    LAPAROSCOPIC, GASTRECTOMY SLEEVE N/A 01/04/2021    Procedure: LAPAROSCOPIC, GASTRECTOMY SLEEVE and Omentopexy;  Surgeon: Delorse Lek, MD;  Location: [facility];  Service: General;  Laterality: N/A;           Family History:    Family History   Problem Relation Age of Onset    Obesity Other     Diabetes Other     Esophageal cancer Neg Hx     Inflammatory bowel disease Neg Hx     Stomach cancer Neg Hx            Social History:    Social History     Tobacco Use    Smoking status: Never    Smokeless tobacco: Never   Vaping Use    Vaping Use: Never used   Substance Use Topics    Alcohol use: Never    Drug use: Yes     Types: Benzodiazepines     Comment: medical marijuana card 4 times a week as needed            The following sections were reviewed this encounter by the provider:            Vital Signs:    There were no vitals taken for this visit.         ROS:    Review of Systems           Physical Exam:    Physical Exam         Assessment:    There are no diagnoses linked to this encounter.          Plan:  Follow-up:    No follow-ups on file.         Haralambos Yeatts [name], MD

## 2022-10-03 ENCOUNTER — Ambulatory Visit (INDEPENDENT_AMBULATORY_CARE_PROVIDER_SITE_OTHER): Payer: Medicaid Other | Admitting: Family Medicine

## 2022-10-03 NOTE — Progress Notes (Deleted)
[facility] [place] [facility]                       Date of Exam: 10/03/2022 9:02 AM        Patient ID: Jason Ponce is a 22 y.o. male.  Attending Physician: Lucianne Lei, MD        Chief Complaint:    No chief complaint on file.              HPI:    22yo M with HTN, morbid obesity s/p gastric bypass, ADHD, anxiety, depression and insomnia here to discuss              Problem List:    Patient Active Problem List   Diagnosis    Attention deficit hyperactivity disorder, combined type    Generalized anxiety disorder    Migraine    Moderately severe major depression    Morbid obesity    Gastroesophageal reflux disease without esophagitis    Benign essential hypertension    H/O gastric sleeve    BMI 40.0-44.9, adult    Hyperhidrosis    Insomnia, unspecified type    Vasomotor rhinitis             Current Meds:    No outpatient medications have been marked as taking for the 10/03/22 encounter (Appointment) with Lucianne Lei, MD.          Allergies:    No Known Allergies          Past Surgical History:    Past Surgical History:   Procedure Laterality Date    EGD, BIOPSY N/A 09/16/2020    Procedure: EGD, BIOPSY;  Surgeon: Hunt Oris, MD;  Location: [facility];  Service: General;  Laterality: N/A;    LAPAROSCOPIC, GASTRECTOMY SLEEVE N/A 01/04/2021    Procedure: LAPAROSCOPIC, GASTRECTOMY SLEEVE and Omentopexy;  Surgeon: Annice Pih, MD;  Location: [facility];  Service: General;  Laterality: N/A;           Family History:    Family History   Problem Relation Age of Onset    Obesity Other     Diabetes Other     Esophageal cancer Neg Hx     Inflammatory bowel disease Neg Hx     Stomach cancer Neg Hx            Social History:    Social History     Tobacco Use    Smoking status: Never    Smokeless tobacco: Never   Vaping Use    Vaping Use: Never used   Substance Use Topics    Alcohol use: Never    Drug use: Yes     Types: Benzodiazepines     Comment: medical marijuana card 4  times a week as needed           The following sections were reviewed this encounter by the provider:            Vital Signs:    There were no vitals taken for this visit.         ROS:    Review of Systems           Physical Exam:    Physical Exam         Assessment:    There are no diagnoses linked to this encounter.          Plan:  Follow-up:    No follow-ups on file.         Liisa Picone [name], MD

## 2022-10-04 ENCOUNTER — Telehealth (INDEPENDENT_AMBULATORY_CARE_PROVIDER_SITE_OTHER): Payer: Self-pay | Admitting: Family Medicine

## 2022-10-04 ENCOUNTER — Ambulatory Visit (INDEPENDENT_AMBULATORY_CARE_PROVIDER_SITE_OTHER): Payer: Medicaid Other | Admitting: Family Medicine

## 2022-10-04 ENCOUNTER — Encounter (INDEPENDENT_AMBULATORY_CARE_PROVIDER_SITE_OTHER): Payer: Self-pay | Admitting: Family Medicine

## 2022-10-04 VITALS — BP 151/84 | HR 59 | Temp 98.5°F | Ht 70.0 in | Wt 278.8 lb

## 2022-10-04 DIAGNOSIS — Z6841 Body Mass Index (BMI) 40.0 and over, adult: Secondary | ICD-10-CM

## 2022-10-04 DIAGNOSIS — G478 Other sleep disorders: Secondary | ICD-10-CM

## 2022-10-04 DIAGNOSIS — R400 Somnolence: Secondary | ICD-10-CM

## 2022-10-04 NOTE — Telephone Encounter (Signed)
Dr. Patterson Hammersmith is in network per [facility] plus website.     Referral/Print out mailed to patient

## 2022-10-04 NOTE — Progress Notes (Addendum)
[facility] [place] [facility]                       Date of Exam: 10/04/2022 3:08 PM        Patient ID: Jason Ponce is a 22 y.o. male.  Attending Physician: Arvilla Meres, MD        Chief Complaint:    Chief Complaint   Patient presents with    Discuss sleep issue     Wakes up very tired even after sleeping 12 hours ongoing few years     Medication Refill     Pt is requesting to get a prescription for Lansoprazole - he has been purchasing OTC                HPI:    22yo M with HTN, morbid obesity s/p gastric bypass, ADHD, anxiety, depression and insomnia here to discuss excessive sleepiness despite getting 10-12 hours of sleep.  This has been a chronic issue but it has become more noticeable in recent weeks  He isn't sure if he snores  He endorses non-restorative sleep and daytime grogginess  He does have morning headaches  He doesn't have trouble falling asleep.  He denies dietary restrictions, rectal bleeding, CP, SOB, fevers, chills, night sweats              Problem List:    Patient Active Problem List   Diagnosis    Attention deficit hyperactivity disorder, combined type    Generalized anxiety disorder    Migraine    Moderately severe major depression    Morbid obesity    Gastroesophageal reflux disease without esophagitis    Benign essential hypertension    H/O gastric sleeve    BMI 40.0-44.9, adult    Hyperhidrosis    Insomnia, unspecified type    Vasomotor rhinitis    Non-restorative sleep    Daytime somnolence             Current Meds:    Outpatient Medications Marked as Taking for the 10/04/22 encounter (Office Visit) with Arvilla Meres, MD   Medication Sig Dispense Refill    ALPRAZolam (XANAX) 0.5 MG tablet Take 1 tablet (0.5 mg) by mouth 3 (three) times daily as needed for Sleep or Anxiety 30 tablet 2    amphetamine-dextroamphetamine (Adderall XR) 20 MG 24 hr capsule Take 1 capsule (20 mg) by mouth daily 30 capsule 0    buPROPion XL (WELLBUTRIN XL) 150 MG 24 hr tablet  Take 1 tablet (150 mg) by mouth daily 180 tablet 1    Ferrous Sulfate (IRON PO) Take by mouth daily      glycopyrrolate (ROBINUL) 2 MG tablet Take 1 tablet (2 mg) by mouth 2 (two) times daily 180 tablet 1    lansoprazole (PREVACID) 30 MG capsule Take 1 capsule (30 mg) by mouth daily 30 capsule 0    lisinopril (ZESTRIL) 20 MG tablet Take 1 tablet (20 mg) by mouth daily 90 tablet 1    Multiple Vitamin (multivitamin) tablet Take 1 tablet by mouth daily      propranolol (INDERAL LA) 60 MG 24 hr capsule Take 1 capsule (60 mg) by mouth daily 90 capsule 1    venlafaxine (EFFEXOR-XR) 37.5 MG 24 hr capsule Take 1 capsule (37.5 mg) by mouth daily 90 capsule 1          Allergies:    No Known Allergies  Past Surgical History:    Past Surgical History:   Procedure Laterality Date    EGD, BIOPSY N/A 09/16/2020    Procedure: EGD, BIOPSY;  Surgeon: Tommye Standard, MD;  Location: [facility];  Service: General;  Laterality: N/A;    LAPAROSCOPIC, GASTRECTOMY SLEEVE N/A 01/04/2021    Procedure: LAPAROSCOPIC, GASTRECTOMY SLEEVE and Omentopexy;  Surgeon: Delorse Lek, MD;  Location: [facility];  Service: General;  Laterality: N/A;           Family History:    Family History   Problem Relation Age of Onset    Obesity Other     Diabetes Other     Esophageal cancer Neg Hx     Inflammatory bowel disease Neg Hx     Stomach cancer Neg Hx            Social History:    Social History     Tobacco Use    Smoking status: Never    Smokeless tobacco: Never   Vaping Use    Vaping Use: Never used   Substance Use Topics    Alcohol use: Never    Drug use: Yes     Types: Benzodiazepines     Comment: medical marijuana card 4 times a week as needed           The following sections were reviewed this encounter by the provider:   Tobacco  Allergies  Meds  Problems  Med Hx  Surg Hx  Fam Hx             Vital Signs:    BP 151/84 (BP Site: Right arm, Patient Position: Sitting, Cuff Size: Large)   Pulse (!) 59   Temp 98.5 F (36.9 C)  (Tympanic)   Ht 1.778 m (5\' 10" )   Wt 126.5 kg (278 lb 12.8 oz)   BMI 40.00 kg/m          ROS:    Review of Systems           Physical Exam:    Physical Exam  Constitutional:       General: He is not in acute distress.     Appearance: He is obese. He is not ill-appearing.   HENT:      Head: Normocephalic and atraumatic.   Eyes:      Extraocular Movements: Extraocular movements intact.      Conjunctiva/sclera: Conjunctivae normal.      Pupils: Pupils are equal, round, and reactive to light.   Cardiovascular:      Rate and Rhythm: Normal rate and regular rhythm.   Pulmonary:      Effort: Pulmonary effort is normal.      Breath sounds: Normal breath sounds.   Abdominal:      General: Bowel sounds are normal. There is no distension.      Palpations: Abdomen is soft.      Tenderness: There is no abdominal tenderness.   Musculoskeletal:      Right lower leg: No edema.      Left lower leg: No edema.   Neurological:      Mental Status: He is alert.              Assessment:    1. BMI 40.0-44.9, adult  - CBC and differential  - Comprehensive metabolic panel  - TSH, Abn Reflex to Free T4, Serum  - Ambulatory referral to Sleep Medicine; Future    2. Daytime somnolence  -  CBC and differential  - Comprehensive metabolic panel  - TSH, Abn Reflex to Free T4, Serum  - Ambulatory referral to Sleep Medicine; Future    3. Non-restorative sleep  - CBC and differential  - Comprehensive metabolic panel  - TSH, Abn Reflex to Free T4, Serum  - Ambulatory referral to Sleep Medicine; Future            Plan:    Significant concern for sleep apnea  Refer for PSG and check labs            Follow-up:    No follow-ups on file.         Klea Nall Jason Plowman, MD

## 2022-10-05 LAB — COMPREHENSIVE METABOLIC PANEL
ALT: 15 IU/L (ref 0–44)
AST (SGOT): 19 IU/L (ref 0–40)
Albumin/Globulin Ratio: 1.5 (ref 1.2–2.2)
Albumin: 4.7 g/dL (ref 4.3–5.2)
Alkaline Phosphatase: 121 IU/L (ref 51–125)
BUN / Creatinine Ratio: 10 (ref 9–20)
BUN: 8 mg/dL (ref 6–20)
Bilirubin, Total: 0.5 mg/dL (ref 0.0–1.2)
CO2: 22 mmol/L (ref 20–29)
Calcium: 10.3 mg/dL — ABNORMAL HIGH (ref 8.7–10.2)
Chloride: 104 mmol/L (ref 96–106)
Creatinine: 0.8 mg/dL (ref 0.76–1.27)
Globulin, Total: 3.2 g/dL (ref 1.5–4.5)
Glucose: 79 mg/dL (ref 70–99)
Potassium: 4.6 mmol/L (ref 3.5–5.2)
Protein, Total: 7.9 g/dL (ref 6.0–8.5)
Sodium: 141 mmol/L (ref 134–144)
eGFR: 130 mL/min/{1.73_m2} (ref >59)

## 2022-10-05 LAB — CBC AND DIFFERENTIAL
Baso(Absolute): 0.1 10*3/uL (ref 0.0–0.2)
Basophils Automated: 1 %
Eosinophils Absolute: 0.3 10*3/uL (ref 0.0–0.4)
Eosinophils Automated: 5 %
Hematocrit: 44.3 % (ref 37.5–51.0)
Hemoglobin: 14.6 g/dL (ref 13.0–17.7)
Immature Granulocytes Absolute: 0 10*3/uL (ref 0.0–0.1)
Immature Granulocytes: 0 %
Lymphocytes Absolute: 1.9 10*3/uL (ref 0.7–3.1)
Lymphocytes Automated: 30 %
MCH: 27.5 pg (ref 26.6–33.0)
MCHC: 33 g/dL (ref 31.5–35.7)
MCV: 83 fL (ref 79–97)
Monocytes Absolute: 0.4 10*3/uL (ref 0.1–0.9)
Monocytes: 7 %
Neutrophils Absolute Count: 3.6 10*3/uL (ref 1.4–7.0)
Neutrophils: 57 %
Platelets: 285 10*3/uL (ref 150–450)
RBC: 5.31 x10E6/uL (ref 4.14–5.80)
RDW: 13.1 % (ref 11.6–15.4)
WBC: 6.4 10*3/uL (ref 3.4–10.8)

## 2022-10-05 LAB — THYROID STIMULATING HORMONE (TSH), REFLEX ON ABNORMAL TO FREE T4, SERUM: TSH: 1.11 u[IU]/mL (ref 0.450–4.500)

## 2022-10-11 ENCOUNTER — Encounter (INDEPENDENT_AMBULATORY_CARE_PROVIDER_SITE_OTHER): Payer: Self-pay | Admitting: Family Medicine

## 2022-10-17 ENCOUNTER — Telehealth (INDEPENDENT_AMBULATORY_CARE_PROVIDER_SITE_OTHER): Payer: Self-pay | Admitting: Family Medicine

## 2022-10-17 DIAGNOSIS — K219 Gastro-esophageal reflux disease without esophagitis: Secondary | ICD-10-CM

## 2022-10-17 MED ORDER — LANSOPRAZOLE 30 MG PO CPDR
30.0000 mg | DELAYED_RELEASE_CAPSULE | Freq: Every day | ORAL | 0 refills | Status: DC
Start: 2022-10-17 — End: 2022-10-17

## 2022-10-17 MED ORDER — LANSOPRAZOLE 30 MG PO CPDR
30.0000 mg | DELAYED_RELEASE_CAPSULE | Freq: Every day | ORAL | 0 refills | Status: DC
Start: 2022-10-17 — End: 2022-12-07

## 2022-10-17 NOTE — Addendum Note (Signed)
Addended by: Lucianne Lei on: 10/17/2022 04:38 PM     Modules accepted: Orders

## 2022-10-17 NOTE — Telephone Encounter (Signed)
Pt was notified

## 2022-10-17 NOTE — Telephone Encounter (Signed)
Rx sent to pharmacy.

## 2022-10-17 NOTE — Telephone Encounter (Signed)
At 4:05pm, pt left a message requesting to get a refill of Lansoprazole 30mg  to be sent to [facility] in [place]     Pt had a CC on 05/19/2022 and a WME on 03/23/2022

## 2022-10-18 ENCOUNTER — Telehealth (INDEPENDENT_AMBULATORY_CARE_PROVIDER_SITE_OTHER): Payer: Self-pay | Admitting: Family Medicine

## 2022-10-18 NOTE — Telephone Encounter (Signed)
[facility] initiated a PA for Lansoprazole 30mg  via [email or web address]     PA has been processed and approved from 10/18/22 - 10/18/2023     (Key: [id number])

## 2022-11-21 ENCOUNTER — Ambulatory Visit (INDEPENDENT_AMBULATORY_CARE_PROVIDER_SITE_OTHER): Payer: Medicaid Other | Admitting: Family Medicine

## 2022-11-21 NOTE — Progress Notes (Deleted)
[facility] [place] [facility]                       Date of Exam: 11/21/2022 12:24 PM        Patient ID: Jason Ponce is a 22 y.o. male.  Attending Physician: Jason Lei, MD        Chief Complaint:    No chief complaint on file.              HPI:    22yo M here for chronic care management:     # HTN:  -On lisinopril 20  -Diet: fair, eating more on the go but keeping within his recommended caloric limits  -Exercise: regular, 3x per week  -Home BPs: 120/70     # Anxiety/insomnia  -On venlafaxine 37.5, bupropion XL 150, propranolol 60, and PRN alprazolam     # ADHD:  -On adderall XR 20     # Hyperhidrosis   -On glycopyrrolate                    Problem List:    Patient Active Problem List   Diagnosis    Attention deficit hyperactivity disorder, combined type    Generalized anxiety disorder    Migraine    Moderately severe major depression    Morbid obesity    Gastroesophageal reflux disease without esophagitis    Benign essential hypertension    H/O gastric sleeve    BMI 40.0-44.9, adult    Hyperhidrosis    Insomnia, unspecified type    Vasomotor rhinitis    Non-restorative sleep    Daytime somnolence             Current Meds:    No outpatient medications have been marked as taking for the 11/21/22 encounter (Appointment) with Jason Lei, MD.          Allergies:    No Known Allergies          Past Surgical History:    Past Surgical History:   Procedure Laterality Date    EGD, BIOPSY N/A 09/16/2020    Procedure: EGD, BIOPSY;  Surgeon: Jason Oris, MD;  [facility];  Service: General;  Laterality: N/A;    LAPAROSCOPIC, GASTRECTOMY SLEEVE N/A 01/04/2021    Procedure: LAPAROSCOPIC, GASTRECTOMY SLEEVE and Omentopexy;  Surgeon: Jason Pih, MD;  Location: [facility];  Service: General;  Laterality: N/A;           Family History:    Family History   Problem Relation Age of Onset    Obesity Other     Diabetes Other     Esophageal cancer Neg Hx      Inflammatory bowel disease Neg Hx     Stomach cancer Neg Hx            Social History:    Social History     Tobacco Use    Smoking status: Never    Smokeless tobacco: Never   Vaping Use    Vaping Use: Never used   Substance Use Topics    Alcohol use: Never    Drug use: Yes     Types: Benzodiazepines     Comment: medical marijuana card 4 times a week as needed           The following sections were reviewed this encounter by the provider:  Vital Signs:    There were no vitals taken for this visit.         ROS:    Review of Systems   Constitutional:  Negative for chills and fever.   Respiratory:  Negative for shortness of breath.    Cardiovascular:  Negative for chest pain and palpitations.   Gastrointestinal:  Negative for abdominal pain, diarrhea and vomiting.              Physical Exam:    Physical Exam  Constitutional:       General: He is not in acute distress.     Appearance: He is not ill-appearing.   HENT:      Head: Normocephalic and atraumatic.   Eyes:      Extraocular Movements: Extraocular movements intact.      Conjunctiva/sclera: Conjunctivae normal.      Pupils: Pupils are equal, round, and reactive to light.   Cardiovascular:      Rate and Rhythm: Normal rate and regular rhythm.   Pulmonary:      Effort: Pulmonary effort is normal.      Breath sounds: Normal breath sounds.   Abdominal:      General: Bowel sounds are normal. There is no distension.      Palpations: Abdomen is soft.      Tenderness: There is no abdominal tenderness.   Musculoskeletal:      Right lower leg: No edema.      Left lower leg: No edema.   Neurological:      Mental Status: He is alert.              Assessment:    There are no diagnoses linked to this encounter.          Plan:    Chronic issues discussed  Medications refilled for 6 months            Follow-up:    No follow-ups on file.         Jason Bugh [name], MD

## 2022-12-05 ENCOUNTER — Other Ambulatory Visit (INDEPENDENT_AMBULATORY_CARE_PROVIDER_SITE_OTHER): Payer: Self-pay | Admitting: Family Medicine

## 2022-12-05 DIAGNOSIS — F411 Generalized anxiety disorder: Secondary | ICD-10-CM

## 2022-12-05 DIAGNOSIS — I10 Essential (primary) hypertension: Secondary | ICD-10-CM

## 2022-12-06 ENCOUNTER — Ambulatory Visit: Payer: Medicaid Other | Attending: Family Medicine | Admitting: Vascular Neurology

## 2022-12-06 ENCOUNTER — Encounter: Payer: Self-pay | Admitting: Vascular Neurology

## 2022-12-06 VITALS — BP 127/80 | HR 71 | Temp 97.9°F | Resp 18 | Wt 280.6 lb

## 2022-12-06 DIAGNOSIS — F5104 Psychophysiologic insomnia: Secondary | ICD-10-CM | POA: Insufficient documentation

## 2022-12-06 DIAGNOSIS — Z9189 Other specified personal risk factors, not elsewhere classified: Secondary | ICD-10-CM | POA: Insufficient documentation

## 2022-12-06 DIAGNOSIS — Z6841 Body Mass Index (BMI) 40.0 and over, adult: Secondary | ICD-10-CM | POA: Insufficient documentation

## 2022-12-06 DIAGNOSIS — G4719 Other hypersomnia: Secondary | ICD-10-CM

## 2022-12-06 DIAGNOSIS — G479 Sleep disorder, unspecified: Secondary | ICD-10-CM | POA: Insufficient documentation

## 2022-12-06 DIAGNOSIS — Z72821 Inadequate sleep hygiene: Secondary | ICD-10-CM

## 2022-12-06 DIAGNOSIS — G478 Other sleep disorders: Secondary | ICD-10-CM | POA: Insufficient documentation

## 2022-12-06 NOTE — Progress Notes (Signed)
[facility]    '[]'$ [facility]  '[x]'$  Neurology Sleep Clinic   '[]'$ Telemedicine     Subjective:      Patient ID: Jason Ponce    Referred from: '[x]'$ Primary Care  '[]'$ Cardiology  '[]'$ Bariatrics  '[]'$ [facility]  '[]'$ Endocrinology  '[]'$ Hematology  '[]'$ Neurology  '[]'$ Other:    CC:  '[]'$ Sleep apnea evaluation '[]'$ Snoring '[]'$ Insomnia '[x]'$ Excessive daytime sleepiness  '[]'$ Restless legs syndrome   '[]'$ Fatigue/tiredness '[]'$ CPAP follow up '[]'$ Follow up    '[]'$ Other:        HPI    This is a 22 year old man who is referred for sleep evaluation.  He says that for the last couple of years he has trouble waking up feeling refreshed and feels very tired during the day and the need to sleep.  Even when he has the opportunity to sleep 12 hours he feels groggy and uncomfortable.  He feels like his nose gets dry if he sleeps for a long time.    After high school he slept longer but it did not really help and he started sleeping more more.  In high school he had difficulty getting to bed on time, and notes if he got 8 hours of sleep he would feel unrefreshed and tired during the day.    He has trouble winding down at night and admits to a lot of screen time.  He has previously tried various over-the-counter medications for sleep including doxylamine, Benadryl, melatonin.  He does have a history of anxiety and depression, the latter of which is controlled but he still gets occasionally anxious.    Snoring '[]'$ Yes   '[]'$ No  '[x]'$ Unknown  '[]'$ Loud   '[]'$ Consistently  '[]'$ Intermittent  '[]'$ Longstanding  Told hold breath or stop breathing at night '[]'$ Yes   '[x]'$ No    Bedtime 12-2 '[]'$ PM  '[x]'$ AM Wake up time 12-2 '[]'$ AM  '[x]'$ PM  Trouble getting to sleep? '[x]'$ Yes   '[]'$ No  If yes, any '[x]'$ Mind racing  '[x]'$ Unable to turn off thoughts  '[]'$ Restless legs symptoms  '[]'$ Physical discomfort  '[]'$ Environmental factors (noise, pets, etc)  '[]'$ "Just can't sleep"  Before bed, use '[]'$ TV  '[x]'$ Phone '[]'$ Tablet/kindle  '[x]'$ Computer  Use in bed? '[x]'$ Yes - phone '[]'$ No     Average  number of hours of total sleep per night: >8 hours in bed/sleep, on avg 9 hours  Wake up at night? '[]'$  Yes  '[x]'$  No   How many times per night? 0 time(s).  Wake up with '[]'$ Gasping/shortness of breath  '[]'$ Coughing  '[]'$ Choking  '[]'$ Kicking  '[]'$ Headaches  '[]'$  Need to urinate  '[]'$  Reflux  '[]'$  Pain  '[]'$ Unknown causes  If yes, trouble getting back to sleep? '[]'$ Yes   '[]'$ No  If yes, any '[]'$ Mind racing  '[]'$ Unable to turn off thoughts  '[]'$ Restless legs symptoms  '[]'$ Physical discomfort  '[]'$ Environmental factors (noise, pets, etc)  '[]'$ "Just can't sleep"    Sleep positions: '[]'$ Back  '[x]'$ Side   '[x]'$  Prone  In the morning, feel refreshed/rested? '[]'$ Yes  '[x]'$ No  Tired during day?  '[x]'$ Yes  '[]'$ No  Caffeine in morning or during day? '[x]'$ Yes - large iced coffee frequently, occasional caffeine pill ('200mg'$ ) '[]'$ No   Nap or doze off during the day? '[]'$ Yes  '[x]'$ No  Any recent weight changes? '[x]'$ Yes - '[]'$ Gained  '[x]'$ Lost - 100 lbs ~ 2 years ago after gastric sleeve '[]'$ No     Sleep Co-morbidities:   '[x]'$ Hypertension   '[]'$ Atrial fibrillation   '[]'$ CAD   '[]'$ Stroke   '[]'$ CHF   '[]'$ Chronic opioid use   '[]'$ COPD  '[x]'$ Anxiety   [  x]Depression       Prior sleep study done? '[]'$ Yes   '[x]'$ No If Yes, results showed: '[]'$ OSA  '[]'$ CSA  '[]'$ No sleep apnea  '[]'$ Inconclusive  '[]'$ PLMD or PLMS    Lab Results   Component Value Date    TSH 1.110 10/04/2022     STOP-BANG QUESTIONNAIRE    Do you snore loudly (louder than talking                 No  or loud enough to be heard through closed doors)?     Do you often feel tired, fatigued, or sleepy during daytime?  YES    Has anyone observed you stop breathing during your sleep?               No    Do you have or are you being treated for high blood pressure? YES    Is your BMI more than 35 kg/m2?     YES    Are you over 37 years old?                   No    Is your neck circumference greater than 40cm?   YES    Are you male?        YES    High risk of OSA: answering yes to three or more items  Low risk of OSA: answering yes to less than three items      Objective:   Body  mass index is 40.26 kg/m.  General: WDWN, NAD. Vital signs reviewed.  HEENT: No nasal discharges, no obvious oral masses. No icterus or conjunctival hemorrhage noted.  Neck: Full ROM. No neck masses noted. Mallampati '[]'$  I '[]'$  II  '[]'$  III  '[x]'$  IV   Neck size: 42 '[x]'$ cm  '[]'$ inches  Psychiatric: Cooperative with exam, normal affect. Thought process and speech pattern normal.    NEUROLOGICAL EXAM    Mental Status: Awake, alert, oriented. Speech fluent without dysarthria. Follows commands well. Attention and concentration appear intact. Fund of knowledge appears appropriate for education.     Cranial Nerves: '[x]'$ PERRL   '[x]'$ Extraocular movements are full. No nystagmus seen. Face appears symmetric. Hearing is intact to conversational speech. '[x]'$ Cranial nerves II-XII otherwise appears intact.     Motor: '[x]'$ Moves extremities well, without UE drift. '[x]'$ No obvious focal weakness noted. '[x]'$ Bulk appears normal. '[x]'$ No tremor noted.    Sensation: '[x]'$ Light touch appears intact.  '[]'$ Deferred    Coordination: '[x]'$ No obvious dysmetria.  '[]'$ Deferred    Gait: '[x]'$  Appears normal, steady '[]'$ Deferred    Assessment:       1. Chronic insomnia    2. Sleep difficulties    3. Daytime somnolence    4. Poor sleep hygiene    5. At risk for sleep apnea    6. Non-restorative sleep    7. Unrefreshed by sleep    8. Excessive daytime sleepiness    9. 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  He also is at higher risk for sleep apnea as a concurrent condition.    Plan:      1.  Proper sleep hygiene discussed, recommended no screen time for at least 1 hour before bed or in bed.  2.  Referral for cognitive behavioral therapy for insomnia, resources given in AVS.  3.  Optimization of anxiety management through primary care doctor or psychiatry.  4.  Check home sleep study to assess for sleep apnea.  5.  Continue weight loss efforts, goal  BMI less than 25.  6.  Further recommendations pending results of above.  7.  Follow-up in 6 weeks for reassessment.      The patient will return for follow-up as outlined above. If there are any problems with medications (if prescribed) or questions about any available test results, or if there are any new symptoms or changes in current symptoms, the patient is instructed to call the office.         Tinya Cadogan B. Samuel Jester, MD  Director, [facility] Sleep Disorders Program  Assistant Professor, Francene Finders. [employer], Occupational hygienist, Engineer, drilling, Sleep Medicine  Board Certified, Education administrator, Vascular Neurology        *This note was generated by the [employer] speech recognition and may contain inherent errors or omissions not intended by the user. Grammatical errors, random word insertions, deletions, pronoun errors and incomplete sentences are occasional consequences of this technology due to software limitations. Not all errors are caught or corrected. If there are questions or concerns about the content of this note or information contained within the body of this dictation they should be addressed directly with the author for clarification.*

## 2022-12-06 NOTE — Patient Instructions (Signed)
Our plan:     Check sleep study, call [phone] to schedule.  Weight loss for goal BMI < 25.   Avoid sleeping on your back.  Consider elevating the head of the bed or using a wedge pillow.  Practice good sleep hygiene - no electronics/screens/TV/phone in bed or for 1 hour before bed.  Referral for CBT-I.  CBT-I resources:  behavioralsleep.org (find a trained/certified Behavioral Sleep Medicine specialist near you by entering zip code)    Other local CBT-I specialists:  [email or web address] - [facility] - Dr. Johny Drilling [phone]   [email or web address] - Sela Hua, M.A, [place]  [phone]    [facility] [place] [address]  [phone]  [email or web address]     [facility]  [place] [address]  [phone]    [email or web address] - Alveda Reasons, PhD, CBSM (via telehealth)  [facility] - Dr. Manya Silvas (via telehealth)  [email or web address]    Online self-directed CBT-I resources:  [email or web address]  [email or web address]  [email or web address]      Please call the office at either [phone] or [phone] to schedule a follow up appointment as discussed.    Today's Visit:      In today's visit, I reviewed your medications and records relating your health - prior testing, blood work, reports of other health care providers present in your electronic medical record.     If you have pertinent records from any non-[facility] doctors that you would like to review, please have them sent to [place] or bring to them to your next office visit.     A copy of today's visit will be sent to your referring doctor and/or primary care doctor, if you have one listed in our system.    Let me know if there are things we could have done better for your office visit.    Patient satisfaction survey:      If you receive a patient satisfaction survey, I would greatly  appreciate it if you would complete it. We value your feedback.     Contact me online:      Patient Portal online - Please sign up for MyChart -- this is the best way to communicate with your team here.  There is a messaging feature, where you can [place] messages anytime of day.  It is the best way to communicate with [place] and get test results, medication refills, or ask questions.     You can expect to get a response within 24-72 hours during weekdays.  If you do not receive a timely response, resend your request and inform us. My goal is for every question answered ever day.  Average response for a phone call maybe 3 days due to the volume we receive (which is why MyChart is preferred).    If you are having a medical emergency -- call 911, DO NOT [place].     Coupons for medication:      If you have any trouble affording your medications, check out www.goodrx.com for coupons and competitive prices in your area.  If you need further assistance, let us know so we can work with you and your insurance to make sure you get the best care.    Thank you for trusting me with your health.      Take care,     Patterson Hammersmith, MD  Council Mechanic  [facility]

## 2022-12-07 ENCOUNTER — Encounter (INDEPENDENT_AMBULATORY_CARE_PROVIDER_SITE_OTHER): Payer: Self-pay | Admitting: Family Medicine

## 2022-12-07 ENCOUNTER — Ambulatory Visit (INDEPENDENT_AMBULATORY_CARE_PROVIDER_SITE_OTHER): Payer: Medicaid Other | Admitting: Family Medicine

## 2022-12-07 VITALS — BP 133/79 | HR 79 | Temp 97.8°F | Ht 70.0 in | Wt 282.8 lb

## 2022-12-07 DIAGNOSIS — F902 Attention-deficit hyperactivity disorder, combined type: Secondary | ICD-10-CM

## 2022-12-07 DIAGNOSIS — F322 Major depressive disorder, single episode, severe without psychotic features: Secondary | ICD-10-CM

## 2022-12-07 DIAGNOSIS — K219 Gastro-esophageal reflux disease without esophagitis: Secondary | ICD-10-CM

## 2022-12-07 DIAGNOSIS — R61 Generalized hyperhidrosis: Secondary | ICD-10-CM

## 2022-12-07 DIAGNOSIS — I10 Essential (primary) hypertension: Secondary | ICD-10-CM

## 2022-12-07 DIAGNOSIS — Z6841 Body Mass Index (BMI) 40.0 and over, adult: Secondary | ICD-10-CM

## 2022-12-07 DIAGNOSIS — F411 Generalized anxiety disorder: Secondary | ICD-10-CM

## 2022-12-07 MED ORDER — LISINOPRIL 20 MG PO TABS
20.0000 mg | ORAL_TABLET | Freq: Every day | ORAL | 1 refills | Status: DC
Start: 2022-12-07 — End: 2023-04-10

## 2022-12-07 MED ORDER — ALPRAZOLAM 0.5 MG PO TABS
0.5000 mg | ORAL_TABLET | Freq: Three times a day (TID) | ORAL | 2 refills | Status: DC | PRN
Start: 2022-12-07 — End: 2023-11-07

## 2022-12-07 MED ORDER — GLYCOPYRROLATE 2 MG PO TABS
2.0000 mg | ORAL_TABLET | Freq: Two times a day (BID) | ORAL | 1 refills | Status: DC
Start: 2022-12-07 — End: 2023-06-05

## 2022-12-07 MED ORDER — LANSOPRAZOLE 30 MG PO CPDR
30.0000 mg | DELAYED_RELEASE_CAPSULE | Freq: Every day | ORAL | 1 refills | Status: DC
Start: 2022-12-07 — End: 2023-02-16

## 2022-12-07 MED ORDER — AMPHETAMINE-DEXTROAMPHET ER 20 MG PO CP24
20.0000 mg | ORAL_CAPSULE | Freq: Every day | ORAL | 0 refills | Status: DC
Start: 2022-12-07 — End: 2023-01-15

## 2022-12-07 MED ORDER — BUPROPION HCL ER (XL) 150 MG PO TB24
150.0000 mg | ORAL_TABLET | Freq: Every day | ORAL | 1 refills | Status: DC
Start: 2022-12-07 — End: 2023-06-05

## 2022-12-07 MED ORDER — PROPRANOLOL HCL ER 60 MG PO CP24
60.0000 mg | ORAL_CAPSULE | Freq: Every day | ORAL | 1 refills | Status: DC
Start: 2022-12-07 — End: 2023-04-10

## 2022-12-07 MED ORDER — VENLAFAXINE HCL ER 37.5 MG PO CP24
37.5000 mg | ORAL_CAPSULE | Freq: Every day | ORAL | 1 refills | Status: DC
Start: 2022-12-07 — End: 2023-04-10

## 2022-12-07 NOTE — Progress Notes (Signed)
[facility]                       Date of Exam: 12/07/2022 3:18 PM        Patient ID: Jason Ponce is a 22 y.o. male.  Attending Physician: Lucianne Lei, MD        Chief Complaint:    Chief Complaint   Patient presents with    chronic care management               HPI:    22yo M here for chronic care management:     # HTN:  -On lisinopril 20  -Diet: has been going out more with family visiting, but returning back to normal  -Exercise: walking, plans to restart  -Home BPs: 130s/70s     # Anxiety/insomnia  -On venlafaxine 37.5, bupropion XL 150, propranolol 60, and PRN alprazolam     # ADHD:  -On adderall XR 20  -Hasn't been using it consistently but plans to do so     # Hyperhidrosis   -On glycopyrrolate      # GERD:  -takes lansoprazole daily              Problem List:    Patient Active Problem List   Diagnosis    Attention deficit hyperactivity disorder, combined type    Generalized anxiety disorder    Migraine    Moderately severe major depression    Morbid obesity    Gastroesophageal reflux disease without esophagitis    Benign essential hypertension    H/O gastric sleeve    BMI 40.0-44.9, adult    Hyperhidrosis    Insomnia, unspecified type    Vasomotor rhinitis    Non-restorative sleep    Daytime somnolence             Current Meds:    Outpatient Medications Marked as Taking for the 12/07/22 encounter (Office Visit) with Lucianne Lei, MD   Medication Sig Dispense Refill    LORATADINE PO Take 20 mg by mouth daily      Multiple Vitamin (multivitamin) tablet Take 1 tablet by mouth daily      [DISCONTINUED] ALPRAZolam (XANAX) 0.5 MG tablet Take 1 tablet (0.5 mg) by mouth 3 (three) times daily as needed for Sleep or Anxiety 30 tablet 2    [DISCONTINUED] buPROPion XL (WELLBUTRIN XL) 150 MG 24 hr tablet Take 1 tablet (150 mg) by mouth daily 180 tablet 1    [DISCONTINUED] glycopyrrolate (ROBINUL) 2 MG tablet Take 1 tablet (2 mg) by mouth 2 (two) times daily 180  tablet 1    [DISCONTINUED] lansoprazole (PREVACID) 30 MG capsule Take 1 capsule (30 mg) by mouth daily 30 capsule 0    [DISCONTINUED] lisinopril (ZESTRIL) 20 MG tablet Take 1 tablet (20 mg) by mouth daily 90 tablet 1    [DISCONTINUED] propranolol (INDERAL LA) 60 MG 24 hr capsule Take 1 capsule (60 mg) by mouth daily 90 capsule 1    [DISCONTINUED] venlafaxine (EFFEXOR-XR) 37.5 MG 24 hr capsule Take 1 capsule (37.5 mg) by mouth daily 90 capsule 1          Allergies:    No Known Allergies          Past Surgical History:    Past Surgical History:   Procedure Laterality Date    EGD, BIOPSY N/A 09/16/2020    Procedure: EGD, BIOPSY;  Surgeon: Hunt Oris, MD;  Location: [facility];  Service: General;  Laterality: N/A;    LAPAROSCOPIC, GASTRECTOMY SLEEVE N/A 01/04/2021    Procedure: LAPAROSCOPIC, GASTRECTOMY SLEEVE and Omentopexy;  Surgeon: Annice Pih, MD;  Location: [facility];  Service: General;  Laterality: N/A;           Family History:    Family History   Problem Relation Age of Onset    Insomnia Mother     Obesity Other     Diabetes Other     Esophageal cancer Neg Hx     Inflammatory bowel disease Neg Hx     Stomach cancer Neg Hx            Social History:    Social History     Tobacco Use    Smoking status: Some Days     Types: Cigarettes    Smokeless tobacco: Never   Vaping Use    Vaping Use: Never used   Substance Use Topics    Alcohol use: Never    Drug use: Not Currently     Types: Benzodiazepines     Comment: medical marijuana card 4 times a week as needed           The following sections were reviewed this encounter by the provider:   Tobacco  Allergies  Meds  Problems  Med Hx  Surg Hx  Fam Hx             Vital Signs:    BP 133/79 (BP Site: Right arm, Patient Position: Sitting, Cuff Size: Large)   Pulse 79   Temp 97.8 F (36.6 C) (Temporal)   Ht 1.778 m ('5\' 10"'$ )   Wt 128.3 kg (282 lb 12.8 oz)   BMI 40.58 kg/m          ROS:    Review of Systems   Constitutional:  Negative for  chills and fever.   Respiratory:  Negative for shortness of breath.    Cardiovascular:  Negative for chest pain and palpitations.   Gastrointestinal:  Negative for abdominal pain, diarrhea and vomiting.              Physical Exam:    Physical Exam  Constitutional:       General: He is not in acute distress.     Appearance: He is obese. He is not ill-appearing.   HENT:      Head: Normocephalic and atraumatic.   Eyes:      Extraocular Movements: Extraocular movements intact.      Conjunctiva/sclera: Conjunctivae normal.      Pupils: Pupils are equal, round, and reactive to light.   Cardiovascular:      Rate and Rhythm: Normal rate and regular rhythm.   Pulmonary:      Effort: Pulmonary effort is normal.      Breath sounds: Normal breath sounds.   Abdominal:      General: Bowel sounds are normal. There is no distension.      Palpations: Abdomen is soft.      Tenderness: There is no abdominal tenderness.   Musculoskeletal:      Right lower leg: No edema.      Left lower leg: No edema.   Neurological:      Mental Status: He is alert.              Assessment:    1. Generalized anxiety disorder  - ALPRAZolam (XANAX) 0.5 MG tablet; Take 1 tablet (0.5 mg) by  mouth 3 (three) times daily as needed for Sleep or Anxiety  Dispense: 30 tablet; Refill: 2  - propranolol (INDERAL LA) 60 MG 24 hr capsule; Take 1 capsule (60 mg) by mouth daily  Dispense: 90 capsule; Refill: 1  - venlafaxine (EFFEXOR-XR) 37.5 MG 24 hr capsule; Take 1 capsule (37.5 mg) by mouth daily  Dispense: 90 capsule; Refill: 1    2. Attention deficit hyperactivity disorder, combined type  - amphetamine-dextroamphetamine (Adderall XR) 20 MG 24 hr capsule; Take 1 capsule (20 mg) by mouth daily  Dispense: 30 capsule; Refill: 0    3. Moderately severe major depression  - buPROPion XL (WELLBUTRIN XL) 150 MG 24 hr tablet; Take 1 tablet (150 mg) by mouth daily  Dispense: 180 tablet; Refill: 1    4. Hyperhidrosis  - glycopyrrolate (ROBINUL) 2 MG tablet; Take 1 tablet (2  mg) by mouth 2 (two) times daily  Dispense: 180 tablet; Refill: 1    5. Gastroesophageal reflux disease without esophagitis  - lansoprazole (PREVACID) 30 MG capsule; Take 1 capsule (30 mg) by mouth daily  Dispense: 90 capsule; Refill: 1    6. Benign essential hypertension  - lisinopril (ZESTRIL) 20 MG tablet; Take 1 tablet (20 mg) by mouth daily  Dispense: 90 tablet; Refill: 1  - propranolol (INDERAL LA) 60 MG 24 hr capsule; Take 1 capsule (60 mg) by mouth daily  Dispense: 90 capsule; Refill: 1    7. BMI 40.0-44.9, adult    8. Morbid obesity            Plan:    Chronic issues discussed  Medications refilled for 6 months            Follow-up:    Return in about 6 months (around 06/09/2023) for Wellness Exam, Chronic care no FBW.         Vasiliki Smaldone [name], MD

## 2022-12-27 ENCOUNTER — Encounter: Payer: Self-pay | Admitting: Vascular Neurology

## 2023-01-13 ENCOUNTER — Telehealth: Payer: Self-pay | Admitting: Vascular Neurology

## 2023-01-13 NOTE — Telephone Encounter (Signed)
Attempted to contact pt to confirm reason for their f/u w Jamie Kato, NP on 01/18/23. Pt has hst scheduled for 01/20/23. Pt does not need to see Sam until after their hst is done. Pt did not answer.

## 2023-01-15 ENCOUNTER — Other Ambulatory Visit (INDEPENDENT_AMBULATORY_CARE_PROVIDER_SITE_OTHER): Payer: Self-pay | Admitting: Family Medicine

## 2023-01-15 DIAGNOSIS — F902 Attention-deficit hyperactivity disorder, combined type: Secondary | ICD-10-CM

## 2023-01-16 MED ORDER — AMPHETAMINE-DEXTROAMPHET ER 20 MG PO CP24
20.0000 mg | ORAL_CAPSULE | Freq: Every day | ORAL | 0 refills | Status: DC
Start: 2023-01-16 — End: 2023-02-15

## 2023-01-18 ENCOUNTER — Ambulatory Visit: Payer: Medicaid Other | Admitting: Family Nurse Practitioner

## 2023-01-20 ENCOUNTER — Ambulatory Visit: Payer: Medicaid Other

## 2023-02-15 ENCOUNTER — Telehealth (INDEPENDENT_AMBULATORY_CARE_PROVIDER_SITE_OTHER): Payer: Self-pay | Admitting: Family Medicine

## 2023-02-15 DIAGNOSIS — F902 Attention-deficit hyperactivity disorder, combined type: Secondary | ICD-10-CM

## 2023-02-15 MED ORDER — AMPHETAMINE-DEXTROAMPHET ER 20 MG PO CP24
20.0000 mg | ORAL_CAPSULE | Freq: Every day | ORAL | 0 refills | Status: DC
Start: 2023-02-15 — End: 2023-03-17

## 2023-02-15 NOTE — Addendum Note (Signed)
Addended by: Arvilla Meres on: 02/15/2023 04:10 PM     Modules accepted: Orders

## 2023-02-15 NOTE — Telephone Encounter (Signed)
Pt would like a refill of his Adderall XR 20 MG. Pt was last seen on 03.13.2024      Phar:   [id number] - [place] P[place], [place] - [id number]       Please advise

## 2023-02-15 NOTE — Telephone Encounter (Signed)
Rx sent to pharmacy   

## 2023-02-15 NOTE — Telephone Encounter (Signed)
Called and pt has been informed

## 2023-02-16 ENCOUNTER — Encounter (INDEPENDENT_AMBULATORY_CARE_PROVIDER_SITE_OTHER): Payer: Self-pay | Admitting: Family Medicine

## 2023-02-16 ENCOUNTER — Ambulatory Visit (INDEPENDENT_AMBULATORY_CARE_PROVIDER_SITE_OTHER): Payer: Medicaid Other | Admitting: Family Medicine

## 2023-02-16 ENCOUNTER — Other Ambulatory Visit (INDEPENDENT_AMBULATORY_CARE_PROVIDER_SITE_OTHER): Payer: Self-pay | Admitting: Family Medicine

## 2023-02-16 VITALS — BP 123/74 | HR 74 | Temp 98.8°F | Ht 70.0 in | Wt 284.0 lb

## 2023-02-16 DIAGNOSIS — R1013 Epigastric pain: Secondary | ICD-10-CM | POA: Insufficient documentation

## 2023-02-16 DIAGNOSIS — F902 Attention-deficit hyperactivity disorder, combined type: Secondary | ICD-10-CM

## 2023-02-16 DIAGNOSIS — R1310 Dysphagia, unspecified: Secondary | ICD-10-CM

## 2023-02-16 DIAGNOSIS — K219 Gastro-esophageal reflux disease without esophagitis: Secondary | ICD-10-CM

## 2023-02-16 MED ORDER — AMPHETAMINE-DEXTROAMPHETAMINE 10 MG PO TABS
10.0000 mg | ORAL_TABLET | Freq: Every day | ORAL | 0 refills | Status: DC
Start: 2023-02-16 — End: 2023-03-17

## 2023-02-16 NOTE — Progress Notes (Signed)
[facility] [place] [facility]                       Date of Exam: 02/16/2023 10:05 AM        Patient ID: Jason Ponce is a 22 y.o. male.  Attending Physician: Arvilla Meres, MD        Chief Complaint:    Chief Complaint   Patient presents with    Discuss GI Issues      Pt c/o stomach pain when not eating even if he ate 2 hours ago    Discuss getting an instant release prescription for Adderall               HPI:    22yo M with HTN, Anxiety/insomnia, ADHD, Hyperhidrosis, GERD, and morbid obesity s/p gastric bypass here for evaluation of increasingly epigastric abdominal pain over the past 2 years. He describes a squeezing and stretching type pain with associated nausea.  It exclusively occurs on an empty stomach and resolves with eating.  Symptoms were more noticeable 6 months after gastric bypass.  OTC/home Treatments: no  No fevers, chills, vomiting, diarrhea, constipation, hematochezia  Occasional dysphagia  No EtOH use and rare NSAID use    Pt also has an afternoon "crash" with his adderall XR 20 and would like to reinstate an afternoon IR dose              Problem List:    Patient Active Problem List   Diagnosis    Attention deficit hyperactivity disorder, combined type    Generalized anxiety disorder    Migraine    Moderately severe major depression    Morbid obesity    Gastroesophageal reflux disease without esophagitis    Benign essential hypertension    H/O gastric sleeve    BMI 40.0-44.9, adult    Hyperhidrosis    Insomnia, unspecified type    Vasomotor rhinitis    Non-restorative sleep    Daytime somnolence    Epigastric abdominal pain    Dysphagia, unspecified type             Current Meds:    Outpatient Medications Marked as Taking for the 02/16/23 encounter (Office Visit) with Arvilla Meres, MD   Medication Sig Dispense Refill    ALPRAZolam (XANAX) 0.5 MG tablet Take 1 tablet (0.5 mg) by mouth 3 (three) times daily as needed for Sleep or Anxiety 30 tablet 2     amphetamine-dextroamphetamine (Adderall XR) 20 MG 24 hr capsule Take 1 capsule (20 mg) by mouth daily 30 capsule 0    buPROPion XL (WELLBUTRIN XL) 150 MG 24 hr tablet Take 1 tablet (150 mg) by mouth daily 180 tablet 1    glycopyrrolate (ROBINUL) 2 MG tablet Take 1 tablet (2 mg) by mouth 2 (two) times daily 180 tablet 1    lansoprazole (PREVACID) 30 MG capsule TAKE 1 CAPSULE BY MOUTH EVERY DAY 30 capsule 0    lisinopril (ZESTRIL) 20 MG tablet Take 1 tablet (20 mg) by mouth daily 90 tablet 1    LORATADINE PO Take 20 mg by mouth daily      Multiple Vitamin (multivitamin) tablet Take 1 tablet by mouth daily      propranolol (INDERAL LA) 60 MG 24 hr capsule Take 1 capsule (60 mg) by mouth daily 90 capsule 1    venlafaxine (EFFEXOR-XR) 37.5 MG 24 hr capsule Take 1 capsule (37.5 mg) by mouth daily 90 capsule  1          Allergies:    No Known Allergies          Past Surgical History:    Past Surgical History:   Procedure Laterality Date    EGD, BIOPSY N/A 09/16/2020    Procedure: EGD, BIOPSY;  Surgeon: Tommye Standard, MD;  Location: [facility];  Service: General;  Laterality: N/A;    LAPAROSCOPIC, GASTRECTOMY SLEEVE N/A 01/04/2021    Procedure: LAPAROSCOPIC, GASTRECTOMY SLEEVE and Omentopexy;  Surgeon: Delorse Lek, MD;  Location: [facility];  Service: General;  Laterality: N/A;           Family History:    Family History   Problem Relation Age of Onset    Insomnia Mother     Obesity Other     Diabetes Other     Esophageal cancer Neg Hx     Inflammatory bowel disease Neg Hx     Stomach cancer Neg Hx            Social History:    Social History     Tobacco Use    Smoking status: Some Days     Types: Cigarettes    Smokeless tobacco: Never   Vaping Use    Vaping status: Never Used   Substance Use Topics    Alcohol use: Never    Drug use: Not Currently     Types: Benzodiazepines     Comment: medical marijuana card 4 times a week as needed           The following sections were reviewed this encounter by the provider:    Tobacco  Allergies  Meds  Problems  Med Hx  Surg Hx  Fam Hx             Vital Signs:    BP 123/74 (BP Site: Left arm, Patient Position: Sitting, Cuff Size: Large)   Pulse 74   Temp 98.8 F (37.1 C) (Tympanic)   Ht 1.778 m (5\' 10" )   Wt 128.8 kg (284 lb)   BMI 40.75 kg/m          ROS:    Review of Systems   All other systems reviewed and are negative.             Physical Exam:    Physical Exam  Constitutional:       General: He is not in acute distress.     Appearance: He is obese. He is not ill-appearing.   HENT:      Head: Normocephalic and atraumatic.   Eyes:      Extraocular Movements: Extraocular movements intact.      Conjunctiva/sclera: Conjunctivae normal.      Pupils: Pupils are equal, round, and reactive to light.   Cardiovascular:      Rate and Rhythm: Normal rate and regular rhythm.   Pulmonary:      Effort: Pulmonary effort is normal.      Breath sounds: Normal breath sounds.   Abdominal:      General: Bowel sounds are normal. There is no distension.      Palpations: Abdomen is soft.      Tenderness: There is no abdominal tenderness.   Musculoskeletal:      Right lower leg: No edema.      Left lower leg: No edema.   Neurological:      Mental Status: He is alert.  Assessment:    1. Epigastric abdominal pain  - CBC and differential  - Comprehensive metabolic panel  - Lipase  - H. pylori breath test  - Referral to Gastroenterology ([place]); Future    2. Dysphagia, unspecified type  - CBC and differential  - Comprehensive metabolic panel  - Lipase  - H. pylori breath test  - Referral to Gastroenterology ([place]); Future    3. Attention deficit hyperactivity disorder, combined type  - amphetamine-dextroamphetamine (ADDERALL) 10 MG tablet; Take 1 tablet (10 mg) by mouth daily  Dispense: 30 tablet; Refill: 0            Plan:    Check labs as noted  H pylori testing will be less accurate due to PPI use  Refer to GI for EGD  Add supplemental adderall IR in the afternoon             Follow-up:    No follow-ups on file.         Tristine Langi Sloan Leiter, MD

## 2023-02-18 LAB — CBC AND DIFFERENTIAL
Baso(Absolute): 0.1 10*3/uL (ref 0.0–0.2)
Basophils Automated: 1 %
Eosinophils Absolute: 0.3 10*3/uL (ref 0.0–0.4)
Eosinophils Automated: 4 %
Hematocrit: 44.2 % (ref 37.5–51.0)
Hemoglobin: 14.5 g/dL (ref 13.0–17.7)
Immature Granulocytes Absolute: 0 10*3/uL (ref 0.0–0.1)
Immature Granulocytes: 0 %
Lymphocytes Absolute: 2.4 10*3/uL (ref 0.7–3.1)
Lymphocytes Automated: 31 %
MCH: 28 pg (ref 26.6–33.0)
MCHC: 32.8 g/dL (ref 31.5–35.7)
MCV: 85 fL (ref 79–97)
Monocytes Absolute: 0.6 10*3/uL (ref 0.1–0.9)
Monocytes: 8 %
Neutrophils Absolute Count: 4.5 10*3/uL (ref 1.4–7.0)
Neutrophils: 56 %
Platelets: 283 10*3/uL (ref 150–450)
RBC: 5.18 x10E6/uL (ref 4.14–5.80)
RDW: 13 % (ref 11.6–15.4)
WBC: 8 10*3/uL (ref 3.4–10.8)

## 2023-02-18 LAB — COMPREHENSIVE METABOLIC PANEL
ALT: 20 IU/L (ref 0–44)
AST (SGOT): 18 IU/L (ref 0–40)
Albumin/Globulin Ratio: 1.4 (ref 1.2–2.2)
Albumin: 4.3 g/dL (ref 4.3–5.2)
Alkaline Phosphatase: 108 IU/L (ref 51–125)
BUN / Creatinine Ratio: 13 (ref 9–20)
BUN: 10 mg/dL (ref 6–20)
Bilirubin, Total: 0.3 mg/dL (ref 0.0–1.2)
CO2: 25 mmol/L (ref 20–29)
Calcium: 10 mg/dL (ref 8.7–10.2)
Chloride: 102 mmol/L (ref 96–106)
Creatinine: 0.78 mg/dL (ref 0.76–1.27)
Globulin, Total: 3.1 g/dL (ref 1.5–4.5)
Glucose: 96 mg/dL (ref 70–99)
Potassium: 4.6 mmol/L (ref 3.5–5.2)
Protein, Total: 7.4 g/dL (ref 6.0–8.5)
Sodium: 140 mmol/L (ref 134–144)
eGFR: 131 mL/min/{1.73_m2} (ref >59)

## 2023-02-18 LAB — LIPASE: Lipase: 34 U/L (ref 13–78)

## 2023-02-18 LAB — HELICOBACTER PYLORI UREA BREATH TEST: H. pylori Breath Test: NEGATIVE

## 2023-03-17 ENCOUNTER — Other Ambulatory Visit (INDEPENDENT_AMBULATORY_CARE_PROVIDER_SITE_OTHER): Payer: Self-pay | Admitting: Family Medicine

## 2023-03-17 DIAGNOSIS — F902 Attention-deficit hyperactivity disorder, combined type: Secondary | ICD-10-CM

## 2023-03-20 MED ORDER — AMPHETAMINE-DEXTROAMPHET ER 20 MG PO CP24
20.0000 mg | ORAL_CAPSULE | Freq: Every day | ORAL | 0 refills | Status: DC
Start: 2023-03-20 — End: 2023-05-09

## 2023-03-20 MED ORDER — AMPHETAMINE-DEXTROAMPHETAMINE 10 MG PO TABS
10.0000 mg | ORAL_TABLET | Freq: Every day | ORAL | 0 refills | Status: DC
Start: 2023-03-20 — End: 2023-05-09

## 2023-04-05 ENCOUNTER — Other Ambulatory Visit (INDEPENDENT_AMBULATORY_CARE_PROVIDER_SITE_OTHER): Payer: Self-pay | Admitting: Family Medicine

## 2023-04-05 DIAGNOSIS — K219 Gastro-esophageal reflux disease without esophagitis: Secondary | ICD-10-CM

## 2023-04-10 ENCOUNTER — Other Ambulatory Visit (INDEPENDENT_AMBULATORY_CARE_PROVIDER_SITE_OTHER): Payer: Self-pay | Admitting: Family Medicine

## 2023-04-10 DIAGNOSIS — F411 Generalized anxiety disorder: Secondary | ICD-10-CM

## 2023-04-10 DIAGNOSIS — I10 Essential (primary) hypertension: Secondary | ICD-10-CM

## 2023-05-05 ENCOUNTER — Other Ambulatory Visit (INDEPENDENT_AMBULATORY_CARE_PROVIDER_SITE_OTHER): Payer: Self-pay | Admitting: Family Medicine

## 2023-05-05 DIAGNOSIS — K219 Gastro-esophageal reflux disease without esophagitis: Secondary | ICD-10-CM

## 2023-05-09 ENCOUNTER — Other Ambulatory Visit (INDEPENDENT_AMBULATORY_CARE_PROVIDER_SITE_OTHER): Payer: Self-pay | Admitting: Family Medicine

## 2023-05-09 DIAGNOSIS — F902 Attention-deficit hyperactivity disorder, combined type: Secondary | ICD-10-CM

## 2023-05-09 MED ORDER — AMPHETAMINE-DEXTROAMPHET ER 20 MG PO CP24
20.0000 mg | ORAL_CAPSULE | Freq: Every day | ORAL | 0 refills | Status: DC
Start: 2023-05-09 — End: 2023-05-12

## 2023-05-09 MED ORDER — AMPHETAMINE-DEXTROAMPHETAMINE 10 MG PO TABS
10.0000 mg | ORAL_TABLET | Freq: Every day | ORAL | 0 refills | Status: DC
Start: 2023-05-09 — End: 2023-05-12

## 2023-05-12 ENCOUNTER — Telehealth (INDEPENDENT_AMBULATORY_CARE_PROVIDER_SITE_OTHER): Payer: Self-pay | Admitting: Family Medicine

## 2023-05-12 DIAGNOSIS — F902 Attention-deficit hyperactivity disorder, combined type: Secondary | ICD-10-CM

## 2023-05-12 NOTE — Telephone Encounter (Signed)
Patient called requesting his Adderall Prescription to be sent to the [facility] on [address] in [place]. The other [facility] on file is a mail order pharmacy and he would like it sent to his local [facility] pharmacy.

## 2023-05-15 MED ORDER — AMPHETAMINE-DEXTROAMPHETAMINE 10 MG PO TABS
10.0000 mg | ORAL_TABLET | Freq: Every day | ORAL | 0 refills | Status: DC
Start: 2023-05-15 — End: 2023-06-05

## 2023-05-15 MED ORDER — AMPHETAMINE-DEXTROAMPHET ER 20 MG PO CP24
20.0000 mg | ORAL_CAPSULE | Freq: Every day | ORAL | 0 refills | Status: DC
Start: 2023-05-15 — End: 2023-06-05

## 2023-05-16 ENCOUNTER — Encounter (INDEPENDENT_AMBULATORY_CARE_PROVIDER_SITE_OTHER): Payer: Medicaid Other | Admitting: Family Medicine

## 2023-05-16 ENCOUNTER — Ambulatory Visit (INDEPENDENT_AMBULATORY_CARE_PROVIDER_SITE_OTHER): Payer: Medicaid Other | Admitting: Physician Assistant

## 2023-06-05 ENCOUNTER — Telehealth (INDEPENDENT_AMBULATORY_CARE_PROVIDER_SITE_OTHER): Payer: Medicaid Other | Admitting: Family Medicine

## 2023-06-05 ENCOUNTER — Encounter (INDEPENDENT_AMBULATORY_CARE_PROVIDER_SITE_OTHER): Payer: Self-pay | Admitting: Family Medicine

## 2023-06-05 DIAGNOSIS — R61 Generalized hyperhidrosis: Secondary | ICD-10-CM

## 2023-06-05 DIAGNOSIS — F902 Attention-deficit hyperactivity disorder, combined type: Secondary | ICD-10-CM

## 2023-06-05 DIAGNOSIS — I10 Essential (primary) hypertension: Secondary | ICD-10-CM

## 2023-06-05 DIAGNOSIS — K219 Gastro-esophageal reflux disease without esophagitis: Secondary | ICD-10-CM

## 2023-06-05 DIAGNOSIS — Z0000 Encounter for general adult medical examination without abnormal findings: Secondary | ICD-10-CM

## 2023-06-05 DIAGNOSIS — F322 Major depressive disorder, single episode, severe without psychotic features: Secondary | ICD-10-CM

## 2023-06-05 DIAGNOSIS — F411 Generalized anxiety disorder: Secondary | ICD-10-CM

## 2023-06-05 MED ORDER — PROPRANOLOL HCL ER 60 MG PO CP24
60.0000 mg | ORAL_CAPSULE | Freq: Every day | ORAL | 0 refills | Status: DC
Start: 2023-06-05 — End: 2023-10-17

## 2023-06-05 MED ORDER — LISDEXAMFETAMINE DIMESYLATE 40 MG PO CAPS
40.0000 mg | ORAL_CAPSULE | Freq: Every morning | ORAL | 0 refills | Status: DC
Start: 2023-06-05 — End: 2023-07-27

## 2023-06-05 MED ORDER — LANSOPRAZOLE 30 MG PO CPDR
30.0000 mg | DELAYED_RELEASE_CAPSULE | Freq: Every day | ORAL | 0 refills | Status: DC
Start: 2023-06-05 — End: 2023-09-19

## 2023-06-05 MED ORDER — BUPROPION HCL ER (XL) 150 MG PO TB24
150.0000 mg | ORAL_TABLET | Freq: Every day | ORAL | 0 refills | Status: DC
Start: 2023-06-05 — End: 2023-11-07

## 2023-06-05 MED ORDER — VENLAFAXINE HCL ER 37.5 MG PO CP24
37.5000 mg | ORAL_CAPSULE | Freq: Every day | ORAL | 0 refills | Status: DC
Start: 2023-06-05 — End: 2023-11-07

## 2023-06-05 MED ORDER — LISINOPRIL 20 MG PO TABS
20.0000 mg | ORAL_TABLET | Freq: Every day | ORAL | 0 refills | Status: DC
Start: 2023-06-05 — End: 2023-11-07

## 2023-06-05 MED ORDER — GLYCOPYRROLATE 2 MG PO TABS
2.0000 mg | ORAL_TABLET | Freq: Two times a day (BID) | ORAL | 0 refills | Status: DC
Start: 2023-06-05 — End: 2023-11-07

## 2023-06-05 NOTE — Progress Notes (Signed)
[facility] [place] [facility]                       Date of Virtual Visit: 06/05/2023 3:09 PM        Patient ID: Jason Ponce is a 22 y.o. male.  Attending Physician: Arvilla Meres, MD       Telemedicine Eligibility:    State Location:  [x]  [place]  []  [place]  []  [place] []  [place]  []  Other:    Physical Location:  [x]  Home  []         []        []          []  Other:    Patient Identity Verification:  [x]  State Issued ID  []  Insurance Eligibility Check  []  Other:    Physical Address Verification: (for 911)  [x]  Yes  []  No    Personal identity shared with patient:  [x]  Yes  []  No    Education on nature of video visit shared with patient:  [x]  Yes  []  No    Emergency plan agreed upon with patient:  [x]  Yes  []  No    If the patient had not had this virtual visit, what would they have done?  []         []         []        []          []  Other:    Visit terminated since not appropriate for virtual care:  [x]  N/A  []  Reason:         Chief Complaint:    Chief Complaint   Patient presents with    chronic care management      Adderall XR 20mg  is out of stock , pt would like to discuss alt medication such as Vyvanse . Pt has been skipping dose of 10mg  but will take two 10mg  on days that he needs it.                HPI:    22yo M here for chronic care management:    # HTN:  -On lisinopril 20  -Diet: fruits/vegetables daily; limits processed/fast foods  -Exercise: walking  -Home BPs: 130s/70s     # Anxiety/insomnia  -On venlafaxine 37.5, bupropion XL 150, and PRN alprazolam  -stopped propranolol due to it causing forgetfulness/brain fog     # ADHD:  -On adderall XR 20 but having trouble finding it  -PRN adderall 10     # Hyperhidrosis   -On glycopyrrolate      # GERD:  -takes lansoprazole daily                Problem List:    Problem List[1]          Current Meds:    Medications Taking[2]       Allergies:    Allergies[3]          Past Surgical History:    Past Surgical  History:   Procedure Laterality Date    EGD, BIOPSY N/A 09/16/2020    Procedure: EGD, BIOPSY;  Surgeon: Tommye Standard, MD;  Location: [facility];  Service: General;  Laterality: N/A;    LAPAROSCOPIC, GASTRECTOMY SLEEVE N/A 01/04/2021    Procedure: LAPAROSCOPIC, GASTRECTOMY SLEEVE and Omentopexy;  Surgeon: Delorse Lek, MD;  Location: [facility];  Service: General;  Laterality: N/A;  Family History:    Family History   Problem Relation Age of Onset    Insomnia Mother     Obesity Other     Diabetes Other     Esophageal cancer Neg Hx     Inflammatory bowel disease Neg Hx     Stomach cancer Neg Hx            Social History:    Social History[4]        The following sections were reviewed this encounter by the provider:   Tobacco  Allergies  Meds  Problems  Med Hx  Surg Hx  Fam Hx             Vital Signs:    There were no vitals taken for this visit.         ROS:    Review of Systems   Constitutional:  Negative for chills and fever.   Respiratory:  Negative for shortness of breath.    Cardiovascular:  Negative for chest pain and palpitations.   Gastrointestinal:  Negative for abdominal pain, diarrhea and vomiting.              Physical Exam:    Physical Exam   GENERAL APPEARANCE: alert, in no acute distress, pleasant, well nourished.   HEAD: normal appearance  EYES: no discharge  EARS: normal hearing  NECK/THYROID: appearance -supple  PSYCH: appropriate affect, appropriate mood, normal speech, normal attention        Assessment:    1. Generalized anxiety disorder  - propranolol (INDERAL LA) 60 MG 24 hr capsule; Take 1 capsule (60 mg) by mouth daily  Dispense: 90 capsule; Refill: 0  - venlafaxine (EFFEXOR-XR) 37.5 MG 24 hr capsule; Take 1 capsule (37.5 mg) by mouth daily  Dispense: 90 capsule; Refill: 0    2. Attention deficit hyperactivity disorder, combined type  - lisdexamfetamine (VYVANSE) 40 MG capsule; Take 1 capsule (40 mg) by mouth every morning  Dispense: 90 capsule; Refill: 0    3.  Moderately severe major depression  - buPROPion XL (WELLBUTRIN XL) 150 MG 24 hr tablet; Take 1 tablet (150 mg) by mouth daily  Dispense: 180 tablet; Refill: 0    4. Hyperhidrosis  - glycopyrrolate (ROBINUL) 2 MG tablet; Take 1 tablet (2 mg) by mouth 2 (two) times daily  Dispense: 180 tablet; Refill: 0    5. Gastroesophageal reflux disease without esophagitis  - lansoprazole (PREVACID) 30 MG capsule; Take 1 capsule (30 mg) by mouth daily  Dispense: 90 capsule; Refill: 0    6. Benign essential hypertension  - lisinopril (ZESTRIL) 20 MG tablet; Take 1 tablet (20 mg) by mouth daily  Dispense: 90 tablet; Refill: 0  - propranolol (INDERAL LA) 60 MG 24 hr capsule; Take 1 capsule (60 mg) by mouth daily  Dispense: 90 capsule; Refill: 0    7. Well adult exam  - Follow Up In Primary Care; Future            Plan:    Chronic issues discussed  Medications refilled for 6 months  Try switching to vyvanse            Follow-up:    No follow-ups on file.         Zaila Crew Sloan Leiter, MD                     [1]   Patient Active Problem List  Diagnosis    Attention deficit hyperactivity disorder, combined type  Generalized anxiety disorder    Migraine    Moderately severe major depression    Morbid obesity    Gastroesophageal reflux disease without esophagitis    Benign essential hypertension    H/O gastric sleeve    BMI 40.0-44.9, adult    Hyperhidrosis    Insomnia, unspecified type    Vasomotor rhinitis    Non-restorative sleep    Daytime somnolence    Epigastric abdominal pain    Dysphagia, unspecified type   [2]   Outpatient Medications Marked as Taking for the 06/05/23 encounter (Telemedicine Visit) with Arvilla Meres, MD   Medication Sig Dispense Refill    LORATADINE PO Take 20 mg by mouth daily      Multiple Vitamin (multivitamin) tablet Take 1 tablet by mouth daily      [DISCONTINUED] amphetamine-dextroamphetamine (ADDERALL) 10 MG tablet Take 1 tablet (10 mg) by mouth daily 30 tablet 0    [DISCONTINUED] buPROPion XL (WELLBUTRIN XL) 150 MG  24 hr tablet Take 1 tablet (150 mg) by mouth daily 180 tablet 1    [DISCONTINUED] glycopyrrolate (ROBINUL) 2 MG tablet Take 1 tablet (2 mg) by mouth 2 (two) times daily 180 tablet 1    [DISCONTINUED] lansoprazole (PREVACID) 30 MG capsule TAKE 1 CAPSULE BY MOUTH EVERY DAY 30 capsule 0    [DISCONTINUED] lisinopril (ZESTRIL) 20 MG tablet TAKE 1 TABLET BY MOUTH EVERY DAY 90 tablet 0    [DISCONTINUED] venlafaxine (EFFEXOR-XR) 37.5 MG 24 hr capsule TAKE 1 CAPSULE BY MOUTH EVERY DAY 90 capsule 0   [3] No Known Allergies  [4]   Social History  Tobacco Use    Smoking status: Some Days     Types: Cigarettes    Smokeless tobacco: Never   Vaping Use    Vaping status: Never Used   Substance Use Topics    Alcohol use: Never    Drug use: Not Currently     Types: Benzodiazepines     Comment: medical marijuana card 4 times a week as needed

## 2023-06-06 ENCOUNTER — Telehealth (INDEPENDENT_AMBULATORY_CARE_PROVIDER_SITE_OTHER): Payer: Self-pay | Admitting: Family Medicine

## 2023-06-06 NOTE — Telephone Encounter (Signed)
(  Key: [id number])    Pharmacy initiated a PA for Vyvanse 40mg  via [email or web address]     PA has been approved , approved for 06/06/2023  through 06/05/2024

## 2023-06-12 ENCOUNTER — Encounter (INDEPENDENT_AMBULATORY_CARE_PROVIDER_SITE_OTHER): Payer: Self-pay | Admitting: Family Medicine

## 2023-06-26 ENCOUNTER — Telehealth (INDEPENDENT_AMBULATORY_CARE_PROVIDER_SITE_OTHER): Payer: Self-pay | Admitting: Family Medicine

## 2023-06-26 NOTE — Telephone Encounter (Signed)
Lvm for patient to call the office to reschedule for his physical. Patient cancelled his appointment via mychart.

## 2023-06-27 ENCOUNTER — Encounter (INDEPENDENT_AMBULATORY_CARE_PROVIDER_SITE_OTHER): Payer: Medicaid Other | Admitting: Family Medicine

## 2023-07-27 ENCOUNTER — Other Ambulatory Visit (INDEPENDENT_AMBULATORY_CARE_PROVIDER_SITE_OTHER): Payer: Self-pay | Admitting: Family Medicine

## 2023-07-27 DIAGNOSIS — F902 Attention-deficit hyperactivity disorder, combined type: Secondary | ICD-10-CM

## 2023-07-28 MED ORDER — LISDEXAMFETAMINE DIMESYLATE 40 MG PO CAPS
40.0000 mg | ORAL_CAPSULE | Freq: Every morning | ORAL | 0 refills | Status: DC
Start: 2023-07-28 — End: 2023-08-30

## 2023-07-31 ENCOUNTER — Encounter (INDEPENDENT_AMBULATORY_CARE_PROVIDER_SITE_OTHER): Payer: Self-pay | Admitting: Family Medicine

## 2023-07-31 ENCOUNTER — Ambulatory Visit (INDEPENDENT_AMBULATORY_CARE_PROVIDER_SITE_OTHER): Payer: Medicaid Other | Admitting: Family Medicine

## 2023-07-31 VITALS — BP 135/83 | HR 88 | Temp 98.6°F | Resp 99 | Ht 71.0 in | Wt 260.0 lb

## 2023-07-31 DIAGNOSIS — R12 Heartburn: Secondary | ICD-10-CM

## 2023-07-31 DIAGNOSIS — Z8619 Personal history of other infectious and parasitic diseases: Secondary | ICD-10-CM

## 2023-07-31 NOTE — Progress Notes (Signed)
 [facility] Note      Patient: Jason Ponce   Date: 07/31/2023   MRN: [id number]       Subjective     Chief Complaint   Patient presents with    Abdominal Pain     6 months ago the patient was diagnosed with H. Pylori infection, and didn't f

## 2023-08-30 ENCOUNTER — Other Ambulatory Visit (INDEPENDENT_AMBULATORY_CARE_PROVIDER_SITE_OTHER): Payer: Self-pay | Admitting: Family Medicine

## 2023-08-30 DIAGNOSIS — F902 Attention-deficit hyperactivity disorder, combined type: Secondary | ICD-10-CM

## 2023-08-31 MED ORDER — LISDEXAMFETAMINE DIMESYLATE 40 MG PO CAPS
40.0000 mg | ORAL_CAPSULE | Freq: Every morning | ORAL | 0 refills | Status: DC
Start: 2023-08-31 — End: 2023-10-16

## 2023-09-18 ENCOUNTER — Other Ambulatory Visit (INDEPENDENT_AMBULATORY_CARE_PROVIDER_SITE_OTHER): Payer: Self-pay | Admitting: Family Medicine

## 2023-09-18 DIAGNOSIS — K219 Gastro-esophageal reflux disease without esophagitis: Secondary | ICD-10-CM

## 2023-10-13 ENCOUNTER — Telehealth (INDEPENDENT_AMBULATORY_CARE_PROVIDER_SITE_OTHER): Payer: Self-pay | Admitting: Family Medicine

## 2023-10-13 DIAGNOSIS — F902 Attention-deficit hyperactivity disorder, combined type: Secondary | ICD-10-CM

## 2023-10-13 NOTE — Telephone Encounter (Signed)
At 4:38pm, pt left a message request to have Vyvanse 40mg  sent to [facility] on file     Pt had a VV CC on 06/05/2023 - pt has a WME scheduled for 1/21

## 2023-10-16 MED ORDER — LISDEXAMFETAMINE DIMESYLATE 40 MG PO CAPS
40.0000 mg | ORAL_CAPSULE | Freq: Every morning | ORAL | 0 refills | Status: DC
Start: 2023-10-16 — End: 2023-11-07

## 2023-10-16 NOTE — Telephone Encounter (Signed)
 Rx sent to pharmacy

## 2023-10-16 NOTE — Addendum Note (Signed)
 Addended by: Ziaire Hagos SOO on: 10/16/2023 05:32 PM     Modules accepted: Orders

## 2023-10-17 ENCOUNTER — Encounter (INDEPENDENT_AMBULATORY_CARE_PROVIDER_SITE_OTHER): Payer: Medicaid Other | Admitting: Family Medicine

## 2023-10-17 ENCOUNTER — Encounter (INDEPENDENT_AMBULATORY_CARE_PROVIDER_SITE_OTHER): Payer: Self-pay | Admitting: Family Medicine

## 2023-10-17 NOTE — Progress Notes (Signed)
 [facility] [place] [facility]                       Date of Exam: 10/17/2023 12:01 PM        Patient ID: Jason Ponce is a 23 y.o. male.  Attending Physician: Lyndal Nanette Leys, MD        Chief Complaint:    No chief complaint on file.              HPI:    Patient presents for an annual wellness exam.    Tobacco Use: no  Alcohol Use: no  Diet: fair, eating more on the go but keeping within his recommended caloric limits  Exercise: regular, 3x per week  Sexual Activity:    Family History of prostate cancer: no  Family History of colon cancer: no  Family history of early CAD: no    Other Issues to discuss:   # HTN:  -On lisinopril  20  -Diet: fruits/vegetables daily; limits processed/fast foods  -Exercise: walking  -Home BPs: 130s/70s     # Anxiety/insomnia  -On venlafaxine  37.5, bupropion  XL 150, and PRN alprazolam   -stopped propranolol  due to it causing forgetfulness/brain fog     # ADHD:  -On vyvanse  40  -PRN adderall 10     # Hyperhidrosis   -On glycopyrrolate       # GERD:  -takes lansoprazole  daily          Problem List:    Problem List[1]          Current Meds:    Medications Taking[2]       Allergies:    Allergies[3]          Past Surgical History:    Past Surgical History[4]        Family History:    Family History[5]        Social History:    Social History[6]        The following sections were reviewed this encounter by the provider:            Vital Signs:    There were no vitals taken for this visit.         ROS:    Review of Systems   Constitutional:  Negative for chills, fatigue, fever and unexpected weight change.   HENT:  Negative for congestion, dental problem, hearing loss, rhinorrhea and sore throat.    Eyes:  Negative for photophobia and visual disturbance.   Respiratory:  Negative for cough and shortness of breath.    Cardiovascular:  Negative for chest pain and palpitations.   Gastrointestinal:  Negative for abdominal pain, blood in stool, constipation, diarrhea  and vomiting.   Genitourinary:  Negative for difficulty urinating, dysuria, frequency, penile discharge and testicular pain.   Musculoskeletal:  Negative for arthralgias and myalgias.   Skin:  Negative for rash.   Neurological:  Negative for weakness and numbness.   Psychiatric/Behavioral:  Negative for dysphoric mood and suicidal ideas. The patient is not nervous/anxious.              Physical Exam:    Physical Exam  Constitutional:       General: He is not in acute distress.     Appearance: Normal appearance. He is obese. He is not ill-appearing.   HENT:      Head: Normocephalic and atraumatic.      Right Ear: Tympanic membrane and ear canal normal.  Left Ear: Tympanic membrane and ear canal normal.      Nose: Nose normal.      Mouth/Throat:      Mouth: Mucous membranes are moist.      Pharynx: Oropharynx is clear.   Eyes:      Extraocular Movements: Extraocular movements intact.      Conjunctiva/sclera: Conjunctivae normal.      Pupils: Pupils are equal, round, and reactive to light.   Cardiovascular:      Rate and Rhythm: Normal rate and regular rhythm.      Heart sounds: No murmur heard.     No friction rub. No gallop.   Pulmonary:      Effort: Pulmonary effort is normal.      Breath sounds: Normal breath sounds. No wheezing, rhonchi or rales.   Abdominal:      General: Bowel sounds are normal. There is no distension.      Palpations: Abdomen is soft. There is no mass.      Tenderness: There is no abdominal tenderness.   Musculoskeletal:      Cervical back: Normal range of motion and neck supple.      Right lower leg: No edema.      Left lower leg: No edema.   Skin:     General: Skin is warm and dry.   Neurological:      General: No focal deficit present.      Mental Status: He is alert.   Psychiatric:         Mood and Affect: Mood normal.         Behavior: Behavior normal.                Assessment:    There are no diagnoses linked to this encounter.          Plan:    Health maintenance items  reviewed  Encouraged consumption of 4-5 servings of fruits/vegetables daily  Encouraged at least 150 minutes of moderate-intensity exercise per week  Labs as below  Immunizations: flu, tdap advised on COVID  Prostate Cancer Screening: not indicated yet  Colon Cancer Screening: not indicated yet  Other items discussed:  Chronic meds refilled          Follow-up:    No follow-ups on file.         [name], MD                   [1]   Patient Active Problem List  Diagnosis    Attention deficit hyperactivity disorder, combined type    Generalized anxiety disorder    Migraine    Moderately severe major depression    Morbid obesity    Gastroesophageal reflux disease without esophagitis    Benign essential hypertension    H/O gastric sleeve    BMI 40.0-44.9, adult    Hyperhidrosis    Insomnia, unspecified type    Vasomotor rhinitis    Non-restorative sleep    Daytime somnolence    Epigastric abdominal pain    Dysphagia, unspecified type   [2]   No outpatient medications have been marked as taking for the 10/17/23 encounter (Appointment) with Leys Lyndal Nanette, MD.   [3] No Known Allergies  [4]   Past Surgical History:  Procedure Laterality Date    EGD, BIOPSY N/A 09/16/2020    Procedure: EGD, BIOPSY;  Surgeon: Rozanne Jody FERNS, MD;  Location: [facility];  Service: General;  Laterality: N/A;  LAPAROSCOPIC, GASTRECTOMY SLEEVE N/A 01/04/2021    Procedure: LAPAROSCOPIC, GASTRECTOMY SLEEVE and Omentopexy;  Surgeon: Berlinda Liza DEL, MD;  Location: [facility];  Service: General;  Laterality: N/A;   [5]   Family History  Problem Relation Name Age of Onset    Insomnia Mother      Obesity Other      Diabetes Other      Esophageal cancer Neg Hx      Inflammatory bowel disease Neg Hx      Stomach cancer Neg Hx     [6]   Social History  Tobacco Use    Smoking status: Some Days     Types: Cigarettes    Smokeless tobacco: Never   Vaping Use    Vaping status: Never Used   Substance Use Topics    Alcohol use: Never    Drug use: Not  Currently     Types: Benzodiazepines     Comment: medical marijuana card 4 times a week as needed

## 2023-11-03 ENCOUNTER — Other Ambulatory Visit (INDEPENDENT_AMBULATORY_CARE_PROVIDER_SITE_OTHER): Payer: Self-pay | Admitting: Family Medicine

## 2023-11-03 DIAGNOSIS — F411 Generalized anxiety disorder: Secondary | ICD-10-CM

## 2023-11-06 ENCOUNTER — Other Ambulatory Visit (INDEPENDENT_AMBULATORY_CARE_PROVIDER_SITE_OTHER): Payer: Self-pay | Admitting: Family Medicine

## 2023-11-06 DIAGNOSIS — R61 Generalized hyperhidrosis: Secondary | ICD-10-CM

## 2023-11-06 DIAGNOSIS — F322 Major depressive disorder, single episode, severe without psychotic features: Secondary | ICD-10-CM

## 2023-11-07 ENCOUNTER — Ambulatory Visit (INDEPENDENT_AMBULATORY_CARE_PROVIDER_SITE_OTHER): Payer: Medicaid Other | Admitting: Family Medicine

## 2023-11-07 ENCOUNTER — Encounter (INDEPENDENT_AMBULATORY_CARE_PROVIDER_SITE_OTHER): Payer: Self-pay | Admitting: Family Medicine

## 2023-11-07 ENCOUNTER — Telehealth (INDEPENDENT_AMBULATORY_CARE_PROVIDER_SITE_OTHER): Payer: Medicaid Other | Admitting: Family Medicine

## 2023-11-07 DIAGNOSIS — F411 Generalized anxiety disorder: Secondary | ICD-10-CM

## 2023-11-07 DIAGNOSIS — R61 Generalized hyperhidrosis: Secondary | ICD-10-CM

## 2023-11-07 DIAGNOSIS — I10 Essential (primary) hypertension: Secondary | ICD-10-CM

## 2023-11-07 DIAGNOSIS — F322 Major depressive disorder, single episode, severe without psychotic features: Secondary | ICD-10-CM

## 2023-11-07 DIAGNOSIS — F902 Attention-deficit hyperactivity disorder, combined type: Secondary | ICD-10-CM

## 2023-11-07 DIAGNOSIS — K219 Gastro-esophageal reflux disease without esophagitis: Secondary | ICD-10-CM

## 2023-11-07 MED ORDER — GLYCOPYRROLATE 2 MG PO TABS
2.0000 mg | ORAL_TABLET | Freq: Two times a day (BID) | ORAL | 0 refills | Status: AC
Start: 2023-11-07 — End: ?

## 2023-11-07 MED ORDER — LANSOPRAZOLE 30 MG PO CPDR
30.0000 mg | DELAYED_RELEASE_CAPSULE | Freq: Every day | ORAL | 0 refills | Status: DC
Start: 2023-11-07 — End: 2024-05-22

## 2023-11-07 MED ORDER — ALPRAZOLAM 0.5 MG PO TABS
0.5000 mg | ORAL_TABLET | Freq: Three times a day (TID) | ORAL | 2 refills | Status: DC | PRN
Start: 2023-11-07 — End: 2024-05-22

## 2023-11-07 MED ORDER — LISDEXAMFETAMINE DIMESYLATE 40 MG PO CAPS
40.0000 mg | ORAL_CAPSULE | Freq: Every morning | ORAL | 0 refills | Status: DC
Start: 2023-11-07 — End: 2024-05-22

## 2023-11-07 MED ORDER — BUPROPION HCL ER (XL) 150 MG PO TB24
150.0000 mg | ORAL_TABLET | Freq: Every day | ORAL | 0 refills | Status: DC
Start: 2023-11-07 — End: 2023-11-08

## 2023-11-07 MED ORDER — VENLAFAXINE HCL ER 37.5 MG PO CP24
37.5000 mg | ORAL_CAPSULE | Freq: Every day | ORAL | 0 refills | Status: AC
Start: 2023-11-07 — End: ?

## 2023-11-07 NOTE — Progress Notes (Signed)
 [facility] [place] [facility]                       Date of Virtual Visit: 11/07/2023 1:52 PM        Patient ID: Jason [facility]once is a 23 y.o. male.  Attending Physician: Jason Nanette Leys, MD       Telemedicine Eligibility:    State Location:  [x]  [place]   []  [place]   []  [place] []  [place]   []  Other:    Physical Location:  [x]  Home  []         []        []          []  Other:    Patient Identity Verification:  [x]  State Issued ID  []  Insurance Eligibility Check  []  Other:    Physical Address Verification: (for 911)  [x]  Yes  []  No    Personal identity shared with patient:  [x]  Yes  []  No    Education on nature of video visit shared with patient:  [x]  Yes  []  No    Emergency plan agreed upon with patient:  [x]  Yes  []  No    If the patient had not had this virtual visit, what would they have done?  []         []         []        []          []  Other:    Visit terminated since not appropriate for virtual care:  [x]  N/A  []  Reason:         Chief Complaint:    Chief Complaint   Patient presents with    chronic care management               HPI:    23yo M here for chronic care management:    # HTN:  -Off lisinopril  20 for several months  -Diet: good portion control, getting some vegetables  -Exercise: walking  -Home BPs: 120s/70s     # Anxiety/insomnia  -On venlafaxine  37.5, bupropion  XL 150, and PRN alprazolam   -stopped propranolol  due to it causing forgetfulness/brain fog     # ADHD:  -On vyvanse  40     # Hyperhidrosis   -On glycopyrrolate       # GERD:  -takes lansoprazole  daily  -denies dysphagia, vomiting, or early satiety                Problem List:    Problem List[1]          Current Meds:    Medications Taking[2]       Allergies:    Allergies[3]          Past Surgical History:    Past Surgical History[4]        Family History:    Family History[5]        Social History:    Social History[6]        The following sections were reviewed this encounter by the provider:    Tobacco  Allergies  Meds  Problems  Med Hx  Surg Hx  Fam Hx             Vital Signs:    There were no vitals taken for this visit.         ROS:    Review of Systems   Constitutional:  Negative for chills and fever.   Respiratory:  Negative for shortness of breath.    Cardiovascular:  Negative for chest pain and palpitations.   Gastrointestinal:  Negative for abdominal pain, diarrhea and vomiting.              Physical Exam:    Physical Exam   GENERAL APPEARANCE: alert, in no acute distress, pleasant, well nourished.   HEAD: normal appearance  EYES: no discharge  EARS: normal hearing  NECK/THYROID : appearance -supple  PSYCH: appropriate affect, appropriate mood, normal speech, normal attention        Assessment:    1. Moderately severe major depression  - buPROPion  XL (WELLBUTRIN  XL) 150 MG 24 hr tablet; Take 1 tablet (150 mg) by mouth daily  Dispense: 180 tablet; Refill: 0    2. Hyperhidrosis  - glycopyrrolate  (ROBINUL ) 2 MG tablet; Take 1 tablet (2 mg) by mouth 2 (two) times daily  Dispense: 180 tablet; Refill: 0    3. Gastroesophageal reflux disease without esophagitis  - lansoprazole  (PREVACID ) 30 MG capsule; Take 1 capsule (30 mg) by mouth daily  Dispense: 90 capsule; Refill: 0    4. Attention deficit hyperactivity disorder, combined type  - lisdexamfetamine (VYVANSE ) 40 MG capsule; Take 1 capsule (40 mg) by mouth every morning  Dispense: 30 capsule; Refill: 0    5. Benign essential hypertension    6. Generalized anxiety disorder  - venlafaxine  (EFFEXOR -XR) 37.5 MG 24 hr capsule; Take 1 capsule (37.5 mg) by mouth daily  Dispense: 90 capsule; Refill: 0  - ALPRAZolam  (XANAX ) 0.5 MG tablet; Take 1 tablet (0.5 mg) by mouth 3 (three) times daily as needed for Sleep or Anxiety  Dispense: 30 tablet; Refill: 2            Plan:    Chronic issues discussed  Medications refilled   Schedule annual wellness          Follow-up:    No follow-ups on file.         Jason Deming Nanette Leys, MD                     [1]   Patient Active  Problem List  Diagnosis    Attention deficit hyperactivity disorder, combined type    Generalized anxiety disorder    Migraine    Moderately severe major depression    Morbid obesity    Gastroesophageal reflux disease without esophagitis    Benign essential hypertension    H/O gastric sleeve    BMI 40.0-44.9, adult    Hyperhidrosis    Insomnia, unspecified type    Vasomotor rhinitis    Non-restorative sleep    Daytime somnolence    Epigastric abdominal pain    Dysphagia, unspecified type   [2]   Outpatient Medications Marked as Taking for the 11/07/23 encounter (Telemedicine Visit) with Jason Brossard Soo, MD   Medication Sig Dispense Refill    LORATADINE PO Take 20 mg by mouth daily      Multiple Vitamin (multivitamin) tablet Take 1 tablet by mouth daily      [DISCONTINUED] ALPRAZolam  (XANAX ) 0.5 MG tablet Take 1 tablet (0.5 mg) by mouth 3 (three) times daily as needed for Sleep or Anxiety 30 tablet 2    [DISCONTINUED] buPROPion  XL (WELLBUTRIN  XL) 150 MG 24 hr tablet Take 1 tablet (150 mg) by mouth daily 180 tablet 0    [DISCONTINUED] glycopyrrolate  (ROBINUL ) 2 MG tablet Take 1 tablet (2 mg) by mouth 2 (two) times daily 180 tablet 0    [DISCONTINUED] lansoprazole  (  PREVACID ) 30 MG capsule TAKE ONE CAPSULE BY MOUTH ONCE A DAY 30 capsule 0    [DISCONTINUED] lisdexamfetamine (VYVANSE ) 40 MG capsule Take 1 capsule (40 mg) by mouth every morning 30 capsule 0    [DISCONTINUED] lisinopril  (ZESTRIL ) 20 MG tablet Take 1 tablet (20 mg) by mouth daily 90 tablet 0    [DISCONTINUED] venlafaxine  (EFFEXOR -XR) 37.5 MG 24 hr capsule Take 1 capsule (37.5 mg) by mouth daily 90 capsule 0   [3] No Known Allergies  [4]   Past Surgical History:  Procedure Laterality Date    EGD, BIOPSY N/A 09/16/2020    Procedure: EGD, BIOPSY;  Surgeon: Jason Jody FERNS, MD;  [facility];  Service: General;  Laterality: N/A;    LAPAROSCOPIC, GASTRECTOMY SLEEVE N/A 01/04/2021    Procedure: LAPAROSCOPIC, GASTRECTOMY SLEEVE and Omentopexy;  Surgeon:  Jason Liza DEL, MD;  Location: [facility];  Service: General;  Laterality: N/A;   [5]   Family History  Problem Relation Name Age of Onset    Insomnia Mother      Obesity Other      Diabetes Other      Esophageal cancer Neg Hx      Inflammatory bowel disease Neg Hx      Stomach cancer Neg Hx     [6]   Social History  Tobacco Use    Smoking status: Some Days     Types: Cigarettes    Smokeless tobacco: Never   Vaping Use    Vaping status: Never Used   Substance Use Topics    Alcohol use: Never    Drug use: Not Currently     Types: Benzodiazepines     Comment: medical marijuana card 4 times a week as needed

## 2023-11-08 ENCOUNTER — Telehealth (INDEPENDENT_AMBULATORY_CARE_PROVIDER_SITE_OTHER): Payer: Self-pay | Admitting: Family Medicine

## 2023-11-08 DIAGNOSIS — F322 Major depressive disorder, single episode, severe without psychotic features: Secondary | ICD-10-CM

## 2023-11-08 MED ORDER — BUPROPION HCL ER (XL) 150 MG PO TB24
150.0000 mg | ORAL_TABLET | Freq: Every day | ORAL | 0 refills | Status: DC
Start: 2023-11-08 — End: 2023-11-08

## 2023-11-08 MED ORDER — BUPROPION HCL ER (XL) 150 MG PO TB24
150.0000 mg | ORAL_TABLET | Freq: Every day | ORAL | 0 refills | Status: AC
Start: 2023-11-08 — End: ?

## 2023-11-08 NOTE — Telephone Encounter (Signed)
Yes  I resent it

## 2023-11-08 NOTE — Telephone Encounter (Signed)
(  Key: [id number])    Pharmacy  initiated a PA for Bupropion 150mg  ER     PA has been processed and unable to process    Received the following     Resubmit this request using a valid days supply.    You ordered 180 tablets did you mean 90 ?

## 2023-11-08 NOTE — Telephone Encounter (Signed)
 Rx sent to pharmacy

## 2023-11-08 NOTE — Addendum Note (Signed)
 Addended by: Arvilla Meres on: 11/08/2023 04:32 PM     Modules accepted: Orders

## 2023-11-08 NOTE — Addendum Note (Signed)
 Addended by: Lorenza Evangelist SOO on: 11/08/2023 01:05 PM     Modules accepted: Orders

## 2023-11-08 NOTE — Telephone Encounter (Signed)
 Patient was informed.

## 2023-11-08 NOTE — Telephone Encounter (Signed)
 Patient states his buPROPion was sent to the wrong pharmacy and it was suppose to go to [facility] in [place] on [address], patient is asking if it can be sent there instead of [facility]

## 2023-11-29 ENCOUNTER — Encounter (INDEPENDENT_AMBULATORY_CARE_PROVIDER_SITE_OTHER): Payer: Self-pay

## 2023-11-30 ENCOUNTER — Telehealth (INDEPENDENT_AMBULATORY_CARE_PROVIDER_SITE_OTHER): Payer: Self-pay | Admitting: Family Medicine

## 2023-11-30 NOTE — Telephone Encounter (Signed)
(  Key: [id number])      Pharmacy  initiated a PA for Lansoprazole 30mg       PA has been processed and has been approved from 11/30/2023 through 11/29/2024

## 2023-12-04 ENCOUNTER — Encounter (INDEPENDENT_AMBULATORY_CARE_PROVIDER_SITE_OTHER): Payer: Self-pay | Admitting: Family Medicine

## 2023-12-17 ENCOUNTER — Other Ambulatory Visit (INDEPENDENT_AMBULATORY_CARE_PROVIDER_SITE_OTHER): Payer: Self-pay | Admitting: Family Medicine

## 2023-12-17 DIAGNOSIS — F411 Generalized anxiety disorder: Secondary | ICD-10-CM

## 2023-12-18 MED ORDER — VENLAFAXINE HCL ER 37.5 MG PO CP24
37.5000 mg | ORAL_CAPSULE | Freq: Every day | ORAL | 0 refills | Status: DC
Start: 2023-12-18 — End: 2024-05-22

## 2024-02-03 ENCOUNTER — Other Ambulatory Visit (INDEPENDENT_AMBULATORY_CARE_PROVIDER_SITE_OTHER): Payer: Self-pay | Admitting: Family Medicine

## 2024-02-03 DIAGNOSIS — F322 Major depressive disorder, single episode, severe without psychotic features: Secondary | ICD-10-CM

## 2024-03-12 ENCOUNTER — Other Ambulatory Visit (INDEPENDENT_AMBULATORY_CARE_PROVIDER_SITE_OTHER): Payer: Self-pay | Admitting: Family Medicine

## 2024-03-12 DIAGNOSIS — K219 Gastro-esophageal reflux disease without esophagitis: Secondary | ICD-10-CM

## 2024-05-22 ENCOUNTER — Encounter (INDEPENDENT_AMBULATORY_CARE_PROVIDER_SITE_OTHER): Admitting: Family Medicine

## 2024-05-22 ENCOUNTER — Other Ambulatory Visit (INDEPENDENT_AMBULATORY_CARE_PROVIDER_SITE_OTHER): Payer: Self-pay | Admitting: Family Medicine

## 2024-05-22 DIAGNOSIS — F902 Attention-deficit hyperactivity disorder, combined type: Secondary | ICD-10-CM

## 2024-05-22 DIAGNOSIS — K219 Gastro-esophageal reflux disease without esophagitis: Secondary | ICD-10-CM

## 2024-05-22 DIAGNOSIS — F411 Generalized anxiety disorder: Secondary | ICD-10-CM

## 2024-05-22 DIAGNOSIS — F322 Major depressive disorder, single episode, severe without psychotic features: Secondary | ICD-10-CM

## 2024-05-22 MED ORDER — BUPROPION HCL ER (XL) 150 MG PO TB24
150.0000 mg | ORAL_TABLET | Freq: Every day | ORAL | 0 refills | Status: AC
Start: 2024-05-22 — End: ?

## 2024-05-22 MED ORDER — ALPRAZOLAM 0.5 MG PO TABS
0.5000 mg | ORAL_TABLET | Freq: Three times a day (TID) | ORAL | 0 refills | Status: AC | PRN
Start: 2024-05-22 — End: ?

## 2024-05-22 MED ORDER — LISDEXAMFETAMINE DIMESYLATE 40 MG PO CAPS
40.0000 mg | ORAL_CAPSULE | Freq: Every morning | ORAL | 0 refills | Status: AC
Start: 2024-05-22 — End: ?

## 2024-05-22 MED ORDER — VENLAFAXINE HCL ER 37.5 MG PO CP24
37.5000 mg | ORAL_CAPSULE | Freq: Every day | ORAL | 0 refills | Status: AC
Start: 2024-05-22 — End: ?

## 2024-05-22 MED ORDER — LANSOPRAZOLE 30 MG PO CPDR
30.0000 mg | DELAYED_RELEASE_CAPSULE | Freq: Every day | ORAL | 0 refills | Status: AC
Start: 2024-05-22 — End: ?

## 2024-05-22 NOTE — Progress Notes (Unsigned)
 [facility]                       Date of Exam: 05/22/2024 12:46 PM        Patient ID: Jason Ponce is a 23 y.o. male.  Attending Physician: Lyndal Nanette Leys, MD        Chief Complaint:    Chief Complaint   Patient presents with    Chronic care               HPI:    23yo M here for chronic care management:     # Anxiety/insomnia  -On venlafaxine  37.5, bupropion  XL 150, and PRN alprazolam   -stopped propranolol  due to it causing forgetfulness/brain fog     # ADHD:  -On vyvanse  40    # HTN:  -Now lifestyle controlled   -Diet: good portion control, getting some vegetables  -Exercise: walking  -Home BPs: 120s/70s       # Hyperhidrosis   -On glycopyrrolate       # GERD:  -takes lansoprazole  daily  -denies dysphagia, vomiting, or early satiety                      Problem List:    Problem List[1]          Current Meds:    Medications Taking[2]       Allergies:    Allergies[3]          Past Surgical History:    Past Surgical History[4]        Family History:    Family History[5]        Social History:    Social History[6]        The following sections were reviewed this encounter by the provider:            Vital Signs:    There were no vitals taken for this visit.         ROS:    Review of Systems           Physical Exam:    Physical Exam         Assessment:    There are no diagnoses linked to this encounter.          Plan:    Chronic issues discussed  Medications refilled   Schedule annual wellness          Follow-up:    No follow-ups on file.         Neftaly Swiss Nanette Leys, MD                   [1]   Patient Active Problem List  Diagnosis    Attention deficit hyperactivity disorder, combined type    Generalized anxiety disorder    Migraine    Moderately severe major depression (CMS/HCC)    Morbid obesity (CMS/HCC)    Gastroesophageal reflux disease without esophagitis    Benign essential hypertension    H/O gastric sleeve    BMI 40.0-44.9, adult (CMS/HCC)     Hyperhidrosis    Insomnia, unspecified type    Vasomotor rhinitis    Non-restorative sleep    Daytime somnolence    Epigastric abdominal pain    Dysphagia, unspecified type   [2]   No outpatient medications have been marked as taking for the 05/22/24 encounter (Office Visit) with Leys Lyndal Nanette, MD.   [3]  No Known Allergies  [4]   Past Surgical History:  Procedure Laterality Date    EGD, BIOPSY N/A 09/16/2020    Procedure: EGD, BIOPSY;  Surgeon: Rozanne Jody FERNS, MD;  Location: [facility];  Service: General;  Laterality: N/A;    LAPAROSCOPIC, GASTRECTOMY SLEEVE N/A 01/04/2021    Procedure: LAPAROSCOPIC, GASTRECTOMY SLEEVE and Omentopexy;  Surgeon: Berlinda Liza DEL, MD;  Location: [facility];  Service: General;  Laterality: N/A;   [5]   Family History  Problem Relation Name Age of Onset    Insomnia Mother      Obesity Other      Diabetes Other      Esophageal cancer Neg Hx      Inflammatory bowel disease Neg Hx      Stomach cancer Neg Hx     [6]   Social History  Tobacco Use    Smoking status: Some Days     Types: Cigarettes    Smokeless tobacco: Never   Vaping Use    Vaping status: Never Used   Substance Use Topics    Alcohol use: Never    Drug use: Not Currently     Types: Benzodiazepines     Comment: medical marijuana card 4 times a week as needed

## 2024-05-23 ENCOUNTER — Other Ambulatory Visit (INDEPENDENT_AMBULATORY_CARE_PROVIDER_SITE_OTHER): Payer: Self-pay | Admitting: Family Medicine

## 2024-05-23 DIAGNOSIS — F411 Generalized anxiety disorder: Secondary | ICD-10-CM

## 2024-05-28 ENCOUNTER — Ambulatory Visit (INDEPENDENT_AMBULATORY_CARE_PROVIDER_SITE_OTHER): Admitting: Family Medicine

## 2024-05-29 ENCOUNTER — Encounter (INDEPENDENT_AMBULATORY_CARE_PROVIDER_SITE_OTHER): Admitting: Family Medicine

## 2024-05-29 ENCOUNTER — Ambulatory Visit (INDEPENDENT_AMBULATORY_CARE_PROVIDER_SITE_OTHER): Admitting: Family Medicine

## 2024-05-31 ENCOUNTER — Encounter (INDEPENDENT_AMBULATORY_CARE_PROVIDER_SITE_OTHER): Admitting: Family Medicine

## 2024-05-31 ENCOUNTER — Ambulatory Visit (INDEPENDENT_AMBULATORY_CARE_PROVIDER_SITE_OTHER): Admitting: Family Medicine

## 2024-06-21 ENCOUNTER — Other Ambulatory Visit (INDEPENDENT_AMBULATORY_CARE_PROVIDER_SITE_OTHER): Payer: Self-pay | Admitting: Family Medicine

## 2024-06-21 DIAGNOSIS — K219 Gastro-esophageal reflux disease without esophagitis: Secondary | ICD-10-CM

## 2024-06-21 DIAGNOSIS — F411 Generalized anxiety disorder: Secondary | ICD-10-CM

## 2024-06-21 DIAGNOSIS — F322 Major depressive disorder, single episode, severe without psychotic features: Secondary | ICD-10-CM
